# Patient Record
Sex: Female | Born: 1975 | Race: White | Hispanic: No | Marital: Single
Health system: Midwestern US, Community
[De-identification: ages and names within clinical notes are randomized; demographics above are authoritative.]

## PROBLEM LIST (undated history)

## (undated) DIAGNOSIS — F909 Attention-deficit hyperactivity disorder, unspecified type: Secondary | ICD-10-CM

## (undated) DIAGNOSIS — J449 Chronic obstructive pulmonary disease, unspecified: Secondary | ICD-10-CM

## (undated) HISTORY — PX: APPENDECTOMY: SHX54

## (undated) HISTORY — DX: Chronic obstructive pulmonary disease, unspecified: J44.9

## (undated) MED ORDER — CYCLOBENZAPRINE 10 MG TAB
10 mg | ORAL_TABLET | Freq: Three times a day (TID) | ORAL | Status: AC
Start: ? — End: 2012-10-09

## (undated) MED ORDER — HYDROCODONE-ACETAMINOPHEN 5 MG-325 MG TAB: 5-325 mg | ORAL_TABLET | ORAL | Status: AC

## (undated) MED ORDER — NAPROXEN 500 MG TAB: 500 mg | ORAL_TABLET | Freq: Two times a day (BID) | ORAL | Status: AC

---

## 2012-01-10 NOTE — ED Notes (Signed)
Pt c/o pain and swelling to top of left foot all day today. Denies injury.

## 2012-01-11 MED ORDER — OXYCODONE-ACETAMINOPHEN 5 MG-325 MG TAB
5-325 mg | ORAL_TABLET | ORAL | Status: DC | PRN
Start: 2012-01-11 — End: 2012-10-06

## 2012-01-11 MED ORDER — NAPROXEN 500 MG TAB
500 mg | ORAL_TABLET | Freq: Two times a day (BID) | ORAL | Status: AC
Start: 2012-01-11 — End: 2012-01-21

## 2012-01-11 MED ORDER — OXYCODONE-ACETAMINOPHEN 5 MG-325 MG TAB
5-325 mg | ORAL | Status: AC
Start: 2012-01-11 — End: 2012-01-11
  Administered 2012-01-11: 06:00:00 via ORAL

## 2012-01-11 MED FILL — OXYCODONE-ACETAMINOPHEN 5 MG-325 MG TAB: 5-325 mg | ORAL | Qty: 1

## 2012-01-11 NOTE — ED Notes (Signed)
Good post CMS. Demonstrated proper use of

## 2012-01-11 NOTE — ED Provider Notes (Signed)
HPI Comments: Pt is a 36yo female presenting with increasing pain to the L dorsum of the foot that has been increasing for the last 5 days.  Pt denies trauma with increasing swelling.  Pt denies fever or streaking but the pain at time radiates to the ankle.  Pt has been taking OTC meds without relief.      Patient is a 36 y.o. female presenting with foot pain.   Foot Pain   Pertinent negatives include no back pain.        Past Medical History   Diagnosis Date   ??? ADHD (attention deficit hyperactivity disorder)    ??? COPD (chronic obstructive pulmonary disease)         Past Surgical History   Procedure Date   ??? Hx appendectomy    ??? Hx cesarean section          No family history on file.     History     Social History   ??? Marital Status: SINGLE     Spouse Name: N/A     Number of Children: N/A   ??? Years of Education: N/A     Occupational History   ??? Not on file.     Social History Main Topics   ??? Smoking status: Not on file   ??? Smokeless tobacco: Not on file   ??? Alcohol Use:    ??? Drug Use:    ??? Sexually Active:      Other Topics Concern   ??? Not on file     Social History Narrative   ??? No narrative on file                  ALLERGIES: Phenergan      Review of Systems   Constitutional: Negative for fever, chills, fatigue and unexpected weight change.   HENT: Negative for congestion and rhinorrhea.    Respiratory: Negative for chest tightness and shortness of breath.    Cardiovascular: Negative for chest pain, palpitations and leg swelling.   Gastrointestinal: Negative for nausea, vomiting and abdominal pain.   Genitourinary: Negative for dysuria.   Musculoskeletal: Positive for joint swelling and arthralgias. Negative for back pain.   Skin: Negative for rash.   Neurological: Negative for dizziness and weakness.   Psychiatric/Behavioral: The patient is not nervous/anxious.        Filed Vitals:    01/10/12 2351   BP: 112/68   Pulse: 89   Temp: 98.3 ??F (36.8 ??C)   Resp: 20   Height: 5\' 9"  (1.753 m)   Weight: 63.504 kg (140  lb)   SpO2: 97%            Physical Exam   Nursing note and vitals reviewed.  Constitutional: She is oriented to person, place, and time. She appears well-developed and well-nourished. No distress.   HENT:   Head: Normocephalic and atraumatic.   Right Ear: External ear normal.   Left Ear: External ear normal.   Nose: Nose normal.   Mouth/Throat: Oropharynx is clear and moist.   Eyes: Conjunctivae and EOM are normal. Pupils are equal, round, and reactive to light. No scleral icterus.   Neck: Normal range of motion. Neck supple. No JVD present. No tracheal deviation present. No thyromegaly present.   Cardiovascular: Normal rate, regular rhythm, normal heart sounds and intact distal pulses.  Exam reveals no gallop and no friction rub.    No murmur heard.  Pulmonary/Chest: Effort normal and breath sounds normal. She  exhibits no tenderness.   Abdominal: Soft. Bowel sounds are normal. She exhibits no distension. There is no tenderness. There is no rebound and no guarding.   Musculoskeletal: Normal range of motion. She exhibits edema and tenderness.        L foot with 4 cm of dorsum erythema and pain, not hot to touch with pain increasing with hallux flexion and extension, distal sensation and pulses are intact    Lymphadenopathy:     She has no cervical adenopathy.   Neurological: She is alert and oriented to person, place, and time. She has normal reflexes. No cranial nerve deficit. Coordination normal.        No sensory loss, painful gait noted, Motor 5/5   Skin: Skin is warm and dry.   Psychiatric: She has a normal mood and affect. Her behavior is normal. Judgment and thought content normal.        MDM     Differential Diagnosis; Clinical Impression; Plan:     Pt is a 36yo female with dorsum pain to the L foot.  Exam is suggestive of tendinitis over vascular injury and not clinically concerning for infection.  Will treat her pain and perform [place] to look at the artery.  If reassuring will splint and proceed with close  outpt care. DANIELI Jancarlos Thrun, MD 1:50 AM          Procedures    Bedside US done to look at the [facility] and no obvious dilation of the artery noted.  Will splint, treat her pain, and she would like to follow with Dr. Paulina Fusi.      Splint placed by tech and in good position.  Will proceed with close outpt care and to return if at all worsened or concerned. Arnell Sieving, MD 2:21 AM

## 2012-01-11 NOTE — ED Notes (Signed)
Pt discharged from ED. Verbal and written discharge instructions given to patient (or patient representative) and patient (or patient representative) verbalized understanding. Armband removed.

## 2012-10-06 MED ORDER — CYCLOBENZAPRINE 10 MG TAB
10 mg | ORAL | Status: AC
Start: 2012-10-06 — End: 2012-10-06
  Administered 2012-10-06: 13:00:00 via ORAL

## 2012-10-06 MED ADMIN — ketorolac tromethamine (TORADOL) 60 mg/2 mL injection 60 mg: INTRAMUSCULAR | @ 13:00:00 | NDC 00409379601

## 2012-10-06 MED ADMIN — HYDROmorphone (PF) (DILAUDID) injection 1 mg: INTRAMUSCULAR | @ 13:00:00 | NDC 00409128331

## 2012-10-06 MED FILL — CYCLOBENZAPRINE 10 MG TAB: 10 mg | ORAL | Qty: 1

## 2012-10-06 MED FILL — KETOROLAC TROMETHAMINE 60 MG/2 ML IM: 60 mg/2 mL | INTRAMUSCULAR | Qty: 2

## 2012-10-06 MED FILL — HYDROMORPHONE (PF) 1 MG/ML IJ SOLN: 1 mg/mL | INTRAMUSCULAR | Qty: 1

## 2012-10-06 NOTE — ED Notes (Signed)
Pt c/o lower back pain that radiates down right leg. Pt reports trip/fall yesterday.

## 2012-10-06 NOTE — ED Provider Notes (Signed)
HPI Comments: 36 YO F presents to the ED C/O back pain. Pt states that yesterday she was walking in heels, stepped off a curb and into a hole she could not see at about 1600. Pt states that it "jolted" her back and she started having some lower back soreness. Pt took 400 mg Ibuprofen last night with some relief. Pt woke at 0200 this morning with pain but was able to get back to sleep. At 0430 Pt woke again with worsened pain. Pt states that the pain is just above her buttocks and raising her right leg exacerbates it. Pt denies incontinence, saddle paresthesias and any other Sx or complaints. Pt states that her significant other is driving, he is in the ED.        Past Medical History   Diagnosis Date   ??? ADHD (attention deficit hyperactivity disorder)    ??? COPD (chronic obstructive pulmonary disease)         Past Surgical History   Procedure Laterality Date   ??? Hx appendectomy     ??? Hx cesarean section           History reviewed. No pertinent family history.     History     Social History   ??? Marital Status: SINGLE     Spouse Name: N/A     Number of Children: N/A   ??? Years of Education: N/A     Occupational History   ??? Not on file.     Social History Main Topics   ??? Smoking status: Current Every Day Smoker -- 0.50 packs/day   ??? Smokeless tobacco: Never Used   ??? Alcohol Use: No   ??? Drug Use: No   ??? Sexually Active: Not on file     Other Topics Concern   ??? Not on file     Social History Narrative   ??? No narrative on file                  ALLERGIES: Phenergan      Review of Systems  Constitutional:  Denies malaise, fever, chills.   Head:  Denies injury.   Face:  Denies injury or pain.   ENMT:  Denies sore throat.   Neck:  Denies injury or pain.   Chest:  Denies injury.   Cardiac:  Denies chest pain or palpitations.   Respiratory:  Denies cough, wheezing, difficulty breathing, shortness of breath.   GI/ABD:  Denies injury, pain, distention, nausea, vomiting, diarrhea.   GU:  Denies injury, pain, dysuria or urgency.    Back: See HPI.  Pelvis:  Denies injury or pain.   Extremity/MS:  Denies injury or pain.   Neuro:  Denies headache, LOC, dizziness, neurologic symptoms/deficits/paresthesias.   Skin: Denies injury, rash, itching or skin changes.    Filed Vitals:    10/06/12 0730   BP: 114/70   Pulse: 94   Temp: 98.8 ??F (37.1 ??C)   Resp: 16   Height: 5\' 9"  (1.753 m)   Weight: 59.875 kg (132 lb)   SpO2: 100%            Physical Exam   Nursing note and vitals reviewed.  Constitutional: She is oriented to person, place, and time. She appears well-developed and well-nourished.  Non-toxic appearance. She does not have a sickly appearance. She does not appear ill. No distress.   HENT:   Head: Normocephalic and atraumatic.   Mouth/Throat: Oropharynx is clear and moist. No oropharyngeal exudate.  Eyes: Conjunctivae and EOM are normal. Pupils are equal, round, and reactive to light. No scleral icterus.   Neck: Normal range of motion. Neck supple. No hepatojugular reflux and no JVD present. No tracheal deviation present. No thyromegaly present.   Cardiovascular: Normal rate, regular rhythm, S1 normal, S2 normal, normal heart sounds, intact distal pulses and normal pulses.  Exam reveals no gallop, no S3 and no S4.    No murmur heard.  Pulses:       Radial pulses are 2+ on the right side, and 2+ on the left side.        Dorsalis pedis pulses are 2+ on the right side, and 2+ on the left side.   Pulmonary/Chest: Effort normal and breath sounds normal. No respiratory distress. She has no decreased breath sounds. She has no wheezes. She has no rhonchi. She has no rales.   Abdominal: Soft. Normal appearance and bowel sounds are normal. She exhibits no distension and no mass. There is no hepatosplenomegaly. There is no tenderness. There is no rigidity, no rebound, no guarding, no CVA tenderness, no tenderness at McBurney's point and negative Murphy's sign.   Musculoskeletal: Normal range of motion. She exhibits no edema and no tenderness.         Back:    Lymphadenopathy:        Head (right side): No submental, no submandibular, no preauricular and no occipital adenopathy present.        Head (left side): No submental, no submandibular, no preauricular and no occipital adenopathy present.     She has no cervical adenopathy.        Right: No supraclavicular adenopathy present.        Left: No supraclavicular adenopathy present.   Neurological: She is alert and oriented to person, place, and time. She has normal strength and normal reflexes. She is not disoriented. No cranial nerve deficit or sensory deficit. Coordination and gait normal. GCS eye subscore is 4. GCS verbal subscore is 5. GCS motor subscore is 6.   No neurological deficits   Skin: Skin is warm, dry and intact. No rash noted. She is not diaphoretic.   Psychiatric: She has a normal mood and affect. Her speech is normal and behavior is normal. Judgment and thought content normal. Cognition and memory are normal.        MDM     Differential Diagnosis; Clinical Impression; Plan:     Back pain   Back strain  Patient was discharged in stable condition.  Patient was reassessed and is pain free.  Patient was discharged with prescriptions for Flexeril, Vicodin and Naprosyn. Patient is to return to emergency department if any new or worsening condition.    9:37 AM, 10/06/2012      Procedures    Scribe Attestation:   October 06, 2012 at 8:10 AM - Janet Berlin scribing for and in the presence of Dr.Danni Shima, DO     Janet Berlin, Scribe    Provider Attestation:   I personally performed the services described in the documentation, reviewed the documentation, as recorded by the scribe in my presence, and it accurately and completely records my words and actions. October 06, 2012 at 9:41 AM - Maura Crandall, DO

## 2012-10-06 NOTE — ED Notes (Signed)
Pt presents to the ED with lower back pain which radiated to the right leg onset this morning. Pt states last night loss her balance when she stepped into a hole in the ground while wearing high-heels. Pt states shooting pain in the right leg. Pt states pain is 10/10. Pt denies numbness and tingling lower extremities, bilaterally. Pt without nasal flaring, grunting, stridor, or retractions. Pt appears in NOAD.

## 2012-10-06 NOTE — ED Notes (Signed)
Pt resting on the stretcher. Pt states, "pain is not completely gone, but it's better." Pt rates pain 5/10. Pt appears in NOAD.

## 2012-10-06 NOTE — ED Notes (Signed)
Pt states ready for discharge. Pt states she will follow up with PCP as instructed by provider. Pt appears in NOAD.    I have reviewed discharge instructions with the patient. Prescriptions were reviewed with patient instructed not to drink alcohol, drive a car, or operate heavy machinery while taking this medicine. The patient verbalized understanding. Patient seen leaving ED ambulatory without difficulty or need for assistance, with S/O in no sign of distress. Patient armband removed and shredded.

## 2012-10-06 NOTE — ED Notes (Signed)
Pt tolerated injection without difficulties. Pt resting on the stretcher. Pt remains in NOAD.

## 2014-01-23 ENCOUNTER — Encounter (HOSPITAL_COMMUNITY): Payer: Self-pay | Admitting: Emergency Medicine

## 2014-01-23 ENCOUNTER — Emergency Department (INDEPENDENT_AMBULATORY_CARE_PROVIDER_SITE_OTHER)
Admission: EM | Admit: 2014-01-23 | Discharge: 2014-01-23 | Disposition: A | Discharge status: Home / Self Care | Payer: No Typology Code available for payment source | Source: Home / Self Care

## 2014-01-23 DIAGNOSIS — H109 Unspecified conjunctivitis: Secondary | ICD-10-CM

## 2014-01-23 HISTORY — DX: Attention-deficit hyperactivity disorder, unspecified type: F90.9

## 2014-01-23 MED ORDER — HYDROCODONE-ACETAMINOPHEN 5-325 MG PO TABS
1.0000 | ORAL_TABLET | ORAL | Status: DC | PRN
Start: 2014-01-23 — End: 2014-05-27

## 2014-01-23 MED ORDER — ERYTHROMYCIN 5 MG/GM OP OINT: TOPICAL_OINTMENT | OPHTHALMIC | Status: DC

## 2014-01-23 MED ORDER — TETRACAINE HCL 0.5 % OP SOLN
2.0000 [drp] | Freq: Once | OPHTHALMIC | Status: AC
Start: 2014-01-23 — End: 2014-01-23
  Administered 2014-01-23: 2 [drp] via OPHTHALMIC

## 2014-01-23 MED ORDER — TETRACAINE HCL 0.5 % OP SOLN
OPHTHALMIC | Status: AC
  Filled 2014-01-23: qty 2

## 2014-01-23 NOTE — Discharge Instructions (Signed)

## 2014-01-23 NOTE — ED Notes (Signed)
C/o pain and swelling left eye lid since 3-26.

## 2014-01-23 NOTE — ED Provider Notes (Signed)
CSN: [id number]     Arrival date & time 01/23/14  0815 History   First MD Initiated Contact with Patient 01/23/14 [phone]     Chief Complaint  Patient presents with  . Facial Swelling   (Consider location/radiation/quality/duration/timing/severity/associated sxs/prior Treatment) HPI Comments: OS pain for 4 d. After removing her contact lens had a FB sensation. F/B swelling mostly of the upper lid, brown colored drainage. No known trauma   Past Medical History  Diagnosis Date  . Adult ADHD    History reviewed. No pertinent past surgical history. History reviewed. No pertinent family history. History  Substance Use Topics  . Smoking status: Current Every Day Smoker  . Smokeless tobacco: Not on file  . Alcohol Use: No   OB History   Grav Para Term Preterm Abortions TAB SAB Ect Mult Living                 Review of Systems  Eyes: Positive for photophobia, discharge and redness.  All other systems reviewed and are negative.    Allergies  Phenergan  Home Medications   Current Outpatient Rx  Name  Route  Sig  Dispense  Refill  . albuterol (PROVENTIL HFA;VENTOLIN HFA) 108 (90 BASE) MCG/ACT inhaler   Inhalation   Inhale into the lungs every 6 (six) hours as needed for wheezing or shortness of breath.         . amphetamine-dextroamphetamine (ADDERALL) 20 MG tablet   Oral   Take 20 mg by mouth 3 (three) times daily.         . fluticasone-salmeterol (ADVAIR HFA) 45-21 MCG/ACT inhaler   Inhalation   Inhale 2 puffs into the lungs 2 (two) times daily.         Marland Kitchen. erythromycin ophthalmic ointment      Place a 1/2 inch ribbon of ointment into the lower eyelid tid   1 g   0   . HYDROcodone-acetaminophen (NORCO/VICODIN) 5-325 MG per tablet   Oral   Take 1 tablet by mouth every 4 (four) hours as needed.   10 tablet   0    BP 108/52  Pulse 69  Temp(Src) 98.3 F (36.8 C) (Oral)  Resp 12  SpO2 96% Physical Exam  Nursing note and vitals reviewed. Constitutional: She  is oriented to person, place, and time. She appears well-developed and well-nourished. No distress.  Eyes: EOM are normal. Pupils are equal, round, and reactive to light. Left eye exhibits discharge.  Swelling of the upper eye lid. Mild swelling and redness lower lid.  Under magnification and stain no uptake, no FB seen. Eye irrigated.   Neurological: She is alert and oriented to person, place, and time.  Skin: Skin is warm and dry.  Psychiatric: She has a normal mood and affect.    ED Course  Procedures (including critical care time) Labs Review Labs Reviewed - No data to display Imaging Review No results found.   MDM   1. Left conjunctivitis     Erythromycin op oint Warm compresses If not better or worse cal above eye Dr.    Hayden Rasmussenavid Lisa Milian, NP 01/23/14 [phone]

## 2014-01-24 NOTE — ED Provider Notes (Signed)
Medical screening examination/treatment/procedure(s) were performed by a resident physician or non-physician practitioner and as the supervising physician I was immediately available for consultation/collaboration.  Evan Corey, MD    Evan S Corey, MD 01/24/14 1644 

## 2014-05-27 ENCOUNTER — Emergency Department (HOSPITAL_COMMUNITY): Payer: Medicaid Other

## 2014-05-27 ENCOUNTER — Emergency Department (HOSPITAL_COMMUNITY)
Admission: EM | Admit: 2014-05-27 | Discharge: 2014-05-28 | Disposition: A | Discharge status: Home / Self Care | Payer: Medicaid Other | Attending: Emergency Medicine | Admitting: Emergency Medicine

## 2014-05-27 ENCOUNTER — Encounter (HOSPITAL_COMMUNITY): Payer: Self-pay | Admitting: Emergency Medicine

## 2014-05-27 DIAGNOSIS — F909 Attention-deficit hyperactivity disorder, unspecified type: Secondary | ICD-10-CM | POA: Diagnosis not present

## 2014-05-27 DIAGNOSIS — S6980XA Other specified injuries of unspecified wrist, hand and finger(s), initial encounter: Secondary | ICD-10-CM | POA: Diagnosis present

## 2014-05-27 DIAGNOSIS — F17200 Nicotine dependence, unspecified, uncomplicated: Secondary | ICD-10-CM | POA: Insufficient documentation

## 2014-05-27 DIAGNOSIS — S6990XA Unspecified injury of unspecified wrist, hand and finger(s), initial encounter: Secondary | ICD-10-CM | POA: Insufficient documentation

## 2014-05-27 DIAGNOSIS — Y9289 Other specified places as the place of occurrence of the external cause: Secondary | ICD-10-CM | POA: Insufficient documentation

## 2014-05-27 DIAGNOSIS — S62639B Displaced fracture of distal phalanx of unspecified finger, initial encounter for open fracture: Secondary | ICD-10-CM | POA: Insufficient documentation

## 2014-05-27 DIAGNOSIS — Z79899 Other long term (current) drug therapy: Secondary | ICD-10-CM | POA: Insufficient documentation

## 2014-05-27 DIAGNOSIS — W230XXA Caught, crushed, jammed, or pinched between moving objects, initial encounter: Secondary | ICD-10-CM | POA: Insufficient documentation

## 2014-05-27 DIAGNOSIS — Y9389 Activity, other specified: Secondary | ICD-10-CM | POA: Diagnosis not present

## 2014-05-27 NOTE — ED Notes (Signed)
Pt states that she slammed her pinky on her right hand in her truck door this evening.

## 2014-05-28 MED ORDER — OXYCODONE-ACETAMINOPHEN 5-325 MG PO TABS
1.0000 | ORAL_TABLET | Freq: Once | ORAL | Status: AC
  Administered 2014-05-28: 1 via ORAL
  Filled 2014-05-28: qty 1

## 2014-05-28 MED ORDER — CEPHALEXIN 500 MG PO CAPS: 500.0000 mg | ORAL_CAPSULE | Freq: Four times a day (QID) | ORAL | Status: DC

## 2014-05-28 MED ORDER — OXYCODONE-ACETAMINOPHEN 5-325 MG PO TABS: 1.0000 | ORAL_TABLET | ORAL | Status: DC | PRN

## 2014-05-28 NOTE — Discharge Instructions (Signed)
Take the prescribed medication as directed.  Wear splint and leave bandaged for now. Follow-up with Dr. Izora Ribasoley-- call office and schedule appt. Return to the ED for new or worsening symptoms.

## 2014-05-28 NOTE — ED Provider Notes (Signed)
Medical screening examination/treatment/procedure(s) were performed by non-physician practitioner and as supervising physician I was immediately available for consultation/collaboration.   Dione Boozeavid Scarlett Portlock, MD 05/28/14 [phone]

## 2014-05-28 NOTE — ED Provider Notes (Signed)
CSN: [id number]     Arrival date & time 05/27/14  2140 History   First MD Initiated Contact with Patient 05/27/14 2201     Chief Complaint  Patient presents with  . Finger Injury     (Consider location/radiation/quality/duration/timing/severity/associated sxs/prior Treatment) The history is provided by the patient and medical records.   This is a 38 year old female with past medical history significant for ADHD, presenting to the ED for right little finger injury. Patient states she accidentally slammed her finger in the door of her truck this evening.  Sustained a laceration of distal finger and nail was lifted up.  States she has some numb sensations of her distal finger.  Pt is right hand dominant.  Tetanus UTD.  Past Medical History  Diagnosis Date  . Adult ADHD    Past Surgical History  Procedure Laterality Date  . Cesarean section     History reviewed. No pertinent family history. History  Substance Use Topics  . Smoking status: Current Every Day Smoker  . Smokeless tobacco: Not on file  . Alcohol Use: No   OB History   Grav Para Term Preterm Abortions TAB SAB Ect Mult Living   1              Review of Systems  Musculoskeletal: Positive for arthralgias.  Skin: Positive for wound.  All other systems reviewed and are negative.     Allergies  Phenergan  Home Medications   Prior to Admission medications   Medication Sig Start Date End Date Taking? Authorizing Provider  albuterol (PROVENTIL HFA;VENTOLIN HFA) 108 (90 BASE) MCG/ACT inhaler Inhale 2 puffs into the lungs every 6 (six) hours as needed for wheezing or shortness of breath.    Yes Historical Provider, MD  amphetamine-dextroamphetamine (ADDERALL) 20 MG tablet Take 20 mg by mouth 3 (three) times daily.   Yes Historical Provider, MD   BP 111/48  Pulse 73  Resp 16  Ht 5\' 9"  (1.753 m)  Wt 138 lb (62.596 kg)  BMI 20.37 kg/m2  SpO2 97%  Physical Exam  Nursing note and vitals reviewed. Constitutional:  She is oriented to person, place, and time. She appears well-developed and well-nourished. No distress.  HENT:  Head: Normocephalic and atraumatic.  Mouth/Throat: Oropharynx is clear and moist.  Eyes: Conjunctivae and EOM are normal. Pupils are equal, round, and reactive to light.  Neck: Normal range of motion. Neck supple.  Cardiovascular: Normal rate, regular rhythm and normal heart sounds.   Pulmonary/Chest: Effort normal and breath sounds normal. No respiratory distress. She has no wheezes.  Musculoskeletal: Normal range of motion. She exhibits no edema.       Hands: Right little finger with laceration just proximal to nail bed measuring approximately 0.5 cm, bleeding well controlled; nail remains intact at nailbed but uplifted slightly at distal portion; full flexion/extension of finger maintained; strong radial pulse and cap refill; sensation intact diffusely throughout finger  Neurological: She is alert and oriented to person, place, and time.  Skin: Skin is warm and dry. She is not diaphoretic.  Psychiatric: She has a normal mood and affect.    ED Course  Procedures (including critical care time) Labs Review Labs Reviewed - No data to display  Imaging Review Dg Finger Little Right  05/27/2014   CLINICAL DATA:  Shut in car door  EXAM: RIGHT LITTLE FINGER 2+V  COMPARISON:  None.  FINDINGS: Soft tissue injury to the nail  Nondisplaced fracture of the tuft of the distal phalanx best  seen on the lateral view. The fracture is on the ventral surface.  IMPRESSION: Nondisplaced fracture of the ventral tuft of the distal phalanx.   Electronically Signed   By: Marlan Palau M.D.   On: 05/27/2014 23:49     EKG Interpretation None      MDM   Final diagnoses:  Open fracture of tuft of distal phalanx of finger, initial encounter   38 year old female status post right little finger injury after slamming it in a car door. On exam, she has a laceration just proximal to her nailbed and  distal nail is slightly uprooted but remains intact.  Finger remains NVI.  X-ray revealing tuft fracture of distal phalanx.  Patient with essentially open fracture, will leave nail in place for now. Tetanus UTD, will start on abx.  Finger bandaged and placed in splint, will have her FU with hand surgery this week.  Discussed plan with patient, he/she acknowledged understanding and agreed with plan of care.  Return precautions given for new or worsening symptoms.  Case discussed with attending physician, Dr. Preston Fleeting, who agrees with assessment and plan of care.  Garlon Hatchet, PA-C 05/28/14 0103  Garlon Hatchet, PA-C 05/28/14 0127

## 2014-05-30 ENCOUNTER — Telehealth: Payer: Self-pay | Admitting: *Deleted

## 2014-05-30 ENCOUNTER — Other Ambulatory Visit (HOSPITAL_COMMUNITY)
Admission: RE | Admit: 2014-05-30 | Discharge: 2014-05-30 | Disposition: A | Discharge status: Home / Self Care | Payer: Medicaid Other | Source: Ambulatory Visit | Attending: Obstetrics and Gynecology | Admitting: Obstetrics and Gynecology

## 2014-05-30 ENCOUNTER — Encounter: Payer: Self-pay | Admitting: Obstetrics and Gynecology

## 2014-05-30 ENCOUNTER — Ambulatory Visit (INDEPENDENT_AMBULATORY_CARE_PROVIDER_SITE_OTHER): Payer: Medicaid Other | Admitting: Obstetrics and Gynecology

## 2014-05-30 VITALS — BP 133/56 | HR 74 | Wt 147.1 lb

## 2014-05-30 DIAGNOSIS — O3680X1 Pregnancy with inconclusive fetal viability, fetus 1: Secondary | ICD-10-CM

## 2014-05-30 DIAGNOSIS — Z01419 Encounter for gynecological examination (general) (routine) without abnormal findings: Secondary | ICD-10-CM | POA: Insufficient documentation

## 2014-05-30 DIAGNOSIS — F1920 Other psychoactive substance dependence, uncomplicated: Secondary | ICD-10-CM

## 2014-05-30 DIAGNOSIS — Z113 Encounter for screening for infections with a predominantly sexual mode of transmission: Secondary | ICD-10-CM | POA: Diagnosis present

## 2014-05-30 DIAGNOSIS — O09529 Supervision of elderly multigravida, unspecified trimester: Secondary | ICD-10-CM | POA: Insufficient documentation

## 2014-05-30 DIAGNOSIS — O3680X0 Pregnancy with inconclusive fetal viability, not applicable or unspecified: Secondary | ICD-10-CM

## 2014-05-30 DIAGNOSIS — F17200 Nicotine dependence, unspecified, uncomplicated: Secondary | ICD-10-CM

## 2014-05-30 DIAGNOSIS — O0990 Supervision of high risk pregnancy, unspecified, unspecified trimester: Secondary | ICD-10-CM | POA: Insufficient documentation

## 2014-05-30 DIAGNOSIS — O99320 Drug use complicating pregnancy, unspecified trimester: Secondary | ICD-10-CM | POA: Insufficient documentation

## 2014-05-30 DIAGNOSIS — Z1151 Encounter for screening for human papillomavirus (HPV): Secondary | ICD-10-CM | POA: Insufficient documentation

## 2014-05-30 DIAGNOSIS — O34219 Maternal care for unspecified type scar from previous cesarean delivery: Secondary | ICD-10-CM | POA: Insufficient documentation

## 2014-05-30 LAB — POCT URINALYSIS DIP (DEVICE)
GLUCOSE, UA: NEGATIVE mg/dL
Hgb urine dipstick: NEGATIVE
Ketones, ur: 15 mg/dL — AB
Leukocytes, UA: NEGATIVE
Nitrite: NEGATIVE
Protein, ur: NEGATIVE mg/dL
Specific Gravity, Urine: 1.025 (ref 1.005–1.030)
UROBILINOGEN UA: 1 mg/dL (ref 0.0–1.0)
pH: 5.5 (ref 5.0–8.0)

## 2014-05-30 LAB — US OB COMP + 14 WK

## 2014-05-30 MED ORDER — ONDANSETRON 4 MG PO TBDP: 4.0000 mg | ORAL_TABLET | Freq: Four times a day (QID) | ORAL | Status: DC | PRN

## 2014-05-30 MED ORDER — DOCUSATE SODIUM 100 MG PO CAPS: 100.0000 mg | ORAL_CAPSULE | Freq: Two times a day (BID) | ORAL | Status: DC | PRN

## 2014-05-30 NOTE — Progress Notes (Addendum)
Bedside US for FHR and viability; Single IUP,  FHR - 152 per PW doppler, FM observed. Dr. Jolayne Pantheronstant informed of results

## 2014-05-30 NOTE — Addendum Note (Signed)
Addended by: Jill SideAY, DIANE L on: 05/30/2014 12:17 PM   Modules accepted: Orders

## 2014-05-30 NOTE — Progress Notes (Signed)
   Subjective:    Paige PlummerSherri Bailey is a G1P0 Unknown being seen today for her first obstetrical visit.  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 Patient does intend to breast feed. Pregnancy history fully reviewed.  Patient reports no complaints.  Filed Vitals:   05/30/14 1030  BP: 133/56  Pulse: 74  Weight: 147 lb 1.6 oz (66.724 kg)    HISTORY: OB History  Gravida Para Term Preterm AB SAB TAB Ectopic Multiple Living  1             # Outcome Date GA Lbr Len/2nd Weight Sex Delivery Anes PTL Lv  1 CUR              Past Medical History  Diagnosis Date  . Adult ADHD    Past Surgical History  Procedure Laterality Date  . Cesarean section     No family history on file.   Exam    Uterus:     Pelvic Exam:    Perineum: Normal Perineum   Vulva: normal   Vagina:  normal mucosa, normal discharge   pH:    Cervix: closed and long   Adnexa: no mass, fullness, tenderness   Bony Pelvis: android  System: Breast:  normal appearance, no masses or tenderness   Skin: normal coloration and turgor, no rashes    Neurologic: oriented, no focal deficits   Extremities: normal strength, tone, and muscle mass   HEENT extra ocular movement intact   Mouth/Teeth mucous membranes moist, pharynx normal without lesions   Neck supple and no masses   Cardiovascular: regular rate and rhythm   Respiratory:  chest clear, no wheezing, crepitations, rhonchi, normal symmetric air entry   Abdomen: soft, non-tender; bowel sounds normal; no masses,  no organomegaly   Urinary:       Assessment:    Pregnancy: G1P0 There are no active problems to display for this patient.       Plan:     Initial labs drawn. Prenatal vitamins. Problem list reviewed and updated. Genetic Screening discussed : requested.  Ultrasound discussed; fetal survey: requested. Dating ultrasound ordered  Follow up in 4 weeks. 50% of 30  min visit spent on counseling and coordination of care.     Lazarus Sudbury 05/30/2014

## 2014-05-30 NOTE — Telephone Encounter (Signed)
Called MFM and made appt and called patient - 06/25/14 1:00

## 2014-05-30 NOTE — Telephone Encounter (Signed)
Message copied by Gerome ApleyZEYFANG, LINDA L on Thu May 30, 2014 12:33 PM ------      Message from: Raynald BlendZEYFANG, LINDA L      Created: Thu May 30, 2014 12:25 PM       Needs appt for MFM genetic counseling and US- call patient with appt.  ------

## 2014-05-30 NOTE — Progress Notes (Signed)
Reports smoking marijuana to keep her from throwing up. Requests RX for zofran-- states she cannot take phenergan as it makes her "go crazy."  Reports pelvic pain though believes it is r/t constipation-- Requests RX.  Discussed appropriate weight gain (25-35lb); pt. Verbalized understanding.  New OB packet given.  New OB labs today.

## 2014-05-30 NOTE — Telephone Encounter (Signed)
appt for us should be done week of 8/31 per Dr. Jolayne Pantheronstant

## 2014-05-30 NOTE — Patient Instructions (Signed)
First Trimester of Pregnancy The first trimester of pregnancy is from week 1 until the end of week 12 (months 1 through 3). A week after a sperm fertilizes an egg, the egg will implant on the wall of the uterus. This embryo will begin to develop into a baby. Genes from you and your partner are forming the baby. The female genes determine whether the baby is a boy or a girl. At 6-8 weeks, the eyes and face are formed, and the heartbeat can be seen on ultrasound. At the end of 12 weeks, all the baby's organs are formed.  Now that you are pregnant, you will want to do everything you can to have a healthy baby. Two of the most important things are to get good prenatal care and to follow your health care provider's instructions. Prenatal care is all the medical care you receive before the baby's birth. This care will help prevent, find, and treat any problems during the pregnancy and childbirth. BODY CHANGES Your body goes through many changes during pregnancy. The changes vary from woman to woman.   You may gain or lose a couple of pounds at first.  You may feel sick to your stomach (nauseous) and throw up (vomit). If the vomiting is uncontrollable, call your health care provider.  You may tire easily.  You may develop headaches that can be relieved by medicines approved by your health care provider.  You may urinate more often. Painful urination may mean you have a bladder infection.  You may develop heartburn as a result of your pregnancy.  You may develop constipation because certain hormones are causing the muscles that push waste through your intestines to slow down.  You may develop hemorrhoids or swollen, bulging veins (varicose veins).  Your breasts may begin to grow larger and become tender. Your nipples may stick out more, and the tissue that surrounds them (areola) may become darker.  Your gums may bleed and may be sensitive to brushing and flossing.  Dark spots or blotches  (chloasma, mask of pregnancy) may develop on your face. This will likely fade after the baby is born.  Your menstrual periods will stop.  You may have a loss of appetite.  You may develop cravings for certain kinds of food.  You may have changes in your emotions from day to day, such as being excited to be pregnant or being concerned that something may go wrong with the pregnancy and baby.  You may have more vivid and strange dreams.  You may have changes in your hair. These can include thickening of your hair, rapid growth, and changes in texture. Some women also have hair loss during or after pregnancy, or hair that feels dry or thin. Your hair will most likely return to normal after your baby is born. WHAT TO EXPECT AT YOUR PRENATAL VISITS During a routine prenatal visit:  You will be weighed to make sure you and the baby are growing normally.  Your blood pressure will be taken.  Your abdomen will be measured to track your baby's growth.  The fetal heartbeat will be listened to starting around week 10 or 12 of your pregnancy.  Test results from any previous visits will be discussed. Your health care provider may ask you:  How you are feeling.  If you are feeling the baby move.  If you have had any abnormal symptoms, such as leaking fluid, bleeding, severe headaches, or abdominal cramping.  If you have any questions. Other tests   that may be performed during your first trimester include:  Blood tests to find your blood type and to check for the presence of any previous infections. They will also be used to check for low iron levels (anemia) and Rh antibodies. Later in the pregnancy, blood tests for diabetes will be done along with other tests if problems develop.  Urine tests to check for infections, diabetes, or protein in the urine.  An ultrasound to confirm the proper growth and development of the baby.  An amniocentesis to check for possible genetic problems.  Fetal  screens for spina bifida and Down syndrome.  You may need other tests to make sure you and the baby are doing well. HOME CARE INSTRUCTIONS  Medicines  Follow your health care provider's instructions regarding medicine use. Specific medicines may be either safe or unsafe to take during pregnancy.  Take your prenatal vitamins as directed.  If you develop constipation, try taking a stool softener if your health care provider approves. Diet  Eat regular, well-balanced meals. Choose a variety of foods, such as meat or vegetable-based protein, fish, milk and low-fat dairy products, vegetables, fruits, and whole grain breads and cereals. Your health care provider will help you determine the amount of weight gain that is right for you.  Avoid raw meat and uncooked cheese. These carry germs that can cause birth defects in the baby.  Eating four or five small meals rather than three large meals a day may help relieve nausea and vomiting. If you start to feel nauseous, eating a few soda crackers can be helpful. Drinking liquids between meals instead of during meals also seems to help nausea and vomiting.  If you develop constipation, eat more high-fiber foods, such as fresh vegetables or fruit and whole grains. Drink enough fluids to keep your urine clear or pale yellow. Activity and Exercise  Exercise only as directed by your health care provider. Exercising will help you:  Control your weight.  Stay in shape.  Be prepared for labor and delivery.  Experiencing pain or cramping in the lower abdomen or low back is a good sign that you should stop exercising. Check with your health care provider before continuing normal exercises.  Try to avoid standing for long periods of time. Move your legs often if you must stand in one place for a long time.  Avoid heavy lifting.  Wear low-heeled shoes, and practice good posture.  You may continue to have sex unless your health care provider directs you  otherwise. Relief of Pain or Discomfort  Wear a good support bra for breast tenderness.   Take warm sitz baths to soothe any pain or discomfort caused by hemorrhoids. Use hemorrhoid cream if your health care provider approves.   Rest with your legs elevated if you have leg cramps or low back pain.  If you develop varicose veins in your legs, wear support hose. Elevate your feet for 15 minutes, 3-4 times a day. Limit salt in your diet. Prenatal Care  Schedule your prenatal visits by the twelfth week of pregnancy. They are usually scheduled monthly at first, then more often in the last 2 months before delivery.  Write down your questions. Take them to your prenatal visits.  Keep all your prenatal visits as directed by your health care provider. Safety  Wear your seat belt at all times when driving.  Make a list of emergency phone numbers, including numbers for family, friends, the hospital, and police and fire departments. General Tips    Ask your health care provider for a referral to a local prenatal education class. Begin classes no later than at the beginning of month 6 of your pregnancy.  Ask for help if you have counseling or nutritional needs during pregnancy. Your health care provider can offer advice or refer you to specialists for help with various needs.  Do not use hot tubs, steam rooms, or saunas.  Do not douche or use tampons or scented sanitary pads.  Do not cross your legs for long periods of time.  Avoid cat litter boxes and soil used by cats. These carry germs that can cause birth defects in the baby and possibly loss of the fetus by miscarriage or stillbirth.  Avoid all smoking, herbs, alcohol, and medicines not prescribed by your health care provider. Chemicals in these affect the formation and growth of the baby.  Schedule a dentist appointment. At home, brush your teeth with a soft toothbrush and be gentle when you floss. SEEK MEDICAL CARE IF:   You have  dizziness.  You have mild pelvic cramps, pelvic pressure, or nagging pain in the abdominal area.  You have persistent nausea, vomiting, or diarrhea.  You have a bad smelling vaginal discharge.  You have pain with urination.  You notice increased swelling in your face, hands, legs, or ankles. SEEK IMMEDIATE MEDICAL CARE IF:   You have a fever.  You are leaking fluid from your vagina.  You have spotting or bleeding from your vagina.  You have severe abdominal cramping or pain.  You have rapid weight gain or loss.  You vomit blood or material that looks like coffee grounds.  You are exposed to German measles and have never had them.  You are exposed to fifth disease or chickenpox.  You develop a severe headache.  You have shortness of breath.  You have any kind of trauma, such as from a fall or a car accident. Document Released: 10/05/2001 Document Revised: 02/25/2014 Document Reviewed: 08/21/2013 [employer] Patient Information 2015 [employer]. This information is not intended to replace advice given to you by your health care provider. Make sure you discuss any questions you have with your health care provider.  Contraception Choices Contraception (birth control) is the use of any methods or devices to prevent pregnancy. Below are some methods to help avoid pregnancy. HORMONAL METHODS   Contraceptive implant. This is a thin, plastic tube containing progesterone hormone. It does not contain estrogen hormone. Your health care provider inserts the tube in the inner part of the upper arm. The tube can remain in place for up to 3 years. After 3 years, the implant must be removed. The implant prevents the ovaries from releasing an egg (ovulation), thickens the cervical mucus to prevent sperm from entering the uterus, and thins the lining of the inside of the uterus.  Progesterone-only injections. These injections are given every 3 months by your health care provider to prevent  pregnancy. This synthetic progesterone hormone stops the ovaries from releasing eggs. It also thickens cervical mucus and changes the uterine lining. This makes it harder for sperm to survive in the uterus.  Birth control pills. These pills contain estrogen and progesterone hormone. They work by preventing the ovaries from releasing eggs (ovulation). They also cause the cervical mucus to thicken, preventing the sperm from entering the uterus. Birth control pills are prescribed by a health care provider.Birth control pills can also be used to treat heavy periods.  Minipill. This type of birth control pill contains   only the progesterone hormone. They are taken every day of each month and must be prescribed by your health care provider.  Birth control patch. The patch contains hormones similar to those in birth control pills. It must be changed once a week and is prescribed by a health care provider.  Vaginal ring. The ring contains hormones similar to those in birth control pills. It is left in the vagina for 3 weeks, removed for 1 week, and then a new one is put back in place. The patient must be comfortable inserting and removing the ring from the vagina.A health care provider's prescription is necessary.  Emergency contraception. Emergency contraceptives prevent pregnancy after unprotected sexual intercourse. This pill can be taken right after sex or up to 5 days after unprotected sex. It is most effective the sooner you take the pills after having sexual intercourse. Most emergency contraceptive pills are available without a prescription. Check with your pharmacist. Do not use emergency contraception as your only form of birth control. BARRIER METHODS   Female condom. This is a thin sheath (latex or rubber) that is worn over the penis during sexual intercourse. It can be used with spermicide to increase effectiveness.  Female condom. This is a soft, loose-fitting sheath that is put into the vagina  before sexual intercourse.  Diaphragm. This is a soft, latex, dome-shaped barrier that must be fitted by a health care provider. It is inserted into the vagina, along with a spermicidal jelly. It is inserted before intercourse. The diaphragm should be left in the vagina for 6 to 8 hours after intercourse.  Cervical cap. This is a round, soft, latex or plastic cup that fits over the cervix and must be fitted by a health care provider. The cap can be left in place for up to 48 hours after intercourse.  Sponge. This is a soft, circular piece of polyurethane foam. The sponge has spermicide in it. It is inserted into the vagina after wetting it and before sexual intercourse.  Spermicides. These are chemicals that kill or block sperm from entering the cervix and uterus. They come in the form of creams, jellies, suppositories, foam, or tablets. They do not require a prescription. They are inserted into the vagina with an applicator before having sexual intercourse. The process must be repeated every time you have sexual intercourse. INTRAUTERINE CONTRACEPTION  Intrauterine device (IUD). This is a T-shaped device that is put in a woman's uterus during a menstrual period to prevent pregnancy. There are 2 types:  Copper IUD. This type of IUD is wrapped in copper wire and is placed inside the uterus. Copper makes the uterus and fallopian tubes produce a fluid that kills sperm. It can stay in place for 10 years.  Hormone IUD. This type of IUD contains the hormone progestin (synthetic progesterone). The hormone thickens the cervical mucus and prevents sperm from entering the uterus, and it also thins the uterine lining to prevent implantation of a fertilized egg. The hormone can weaken or kill the sperm that get into the uterus. It can stay in place for 3-5 years, depending on which type of IUD is used. PERMANENT METHODS OF CONTRACEPTION  Female tubal ligation. This is when the woman's fallopian tubes are  surgically sealed, tied, or blocked to prevent the egg from traveling to the uterus.  Hysteroscopic sterilization. This involves placing a small coil or insert into each fallopian tube. Your doctor uses a technique called hysteroscopy to do the procedure. The device causes scar tissue   to form. This results in permanent blockage of the fallopian tubes, so the sperm cannot fertilize the egg. It takes about 3 months after the procedure for the tubes to become blocked. You must use another form of birth control for these 3 months.  Female sterilization. This is when the female has the tubes that carry sperm tied off (vasectomy).This blocks sperm from entering the vagina during sexual intercourse. After the procedure, the man can still ejaculate fluid (semen). NATURAL PLANNING METHODS  Natural family planning. This is not having sexual intercourse or using a barrier method (condom, diaphragm, cervical cap) on days the woman could become pregnant.  Calendar method. This is keeping track of the length of each menstrual cycle and identifying when you are fertile.  Ovulation method. This is avoiding sexual intercourse during ovulation.  Symptothermal method. This is avoiding sexual intercourse during ovulation, using a thermometer and ovulation symptoms.  Post-ovulation method. This is timing sexual intercourse after you have ovulated. Regardless of which type or method of contraception you choose, it is important that you use condoms to protect against the transmission of sexually transmitted infections (STIs). Talk with your health care provider about which form of contraception is most appropriate for you. Document Released: 10/11/2005 Document Revised: 10/16/2013 Document Reviewed: 04/05/2013 [employer] Patient Information 2015 [employer]. This information is not intended to replace advice given to you by your health care provider. Make sure you discuss any questions you have with your health care  provider.  Breastfeeding Deciding to breastfeed is one of the best choices you can make for you and your baby. A change in hormones during pregnancy causes your breast tissue to grow and increases the number and size of your milk ducts. These hormones also allow proteins, sugars, and fats from your blood supply to make breast milk in your milk-producing glands. Hormones prevent breast milk from being released before your baby is born as well as prompt milk flow after birth. Once breastfeeding has begun, thoughts of your baby, as well as his or her sucking or crying, can stimulate the release of milk from your milk-producing glands.  BENEFITS OF BREASTFEEDING For Your Baby  Your first milk (colostrum) helps your baby's digestive system function better.   There are antibodies in your milk that help your baby fight off infections.   Your baby has a lower incidence of asthma, allergies, and sudden infant death syndrome.   The nutrients in breast milk are better for your baby than infant formulas and are designed uniquely for your baby's needs.   Breast milk improves your baby's brain development.   Your baby is less likely to develop other conditions, such as childhood obesity, asthma, or type 2 diabetes mellitus.  For You   Breastfeeding helps to create a very special bond between you and your baby.   Breastfeeding is convenient. Breast milk is always available at the correct temperature and costs nothing.   Breastfeeding helps to burn calories and helps you lose the weight gained during pregnancy.   Breastfeeding makes your uterus contract to its prepregnancy size faster and slows bleeding (lochia) after you give birth.   Breastfeeding helps to lower your risk of developing type 2 diabetes mellitus, osteoporosis, and breast or ovarian cancer later in life. SIGNS THAT YOUR BABY IS HUNGRY Early Signs of Hunger  Increased alertness or activity.  Stretching.  Movement of the  head from side to side.  Movement of the head and opening of the mouth when the corner   of the mouth or cheek is stroked (rooting).  Increased sucking sounds, smacking lips, cooing, sighing, or squeaking.  Hand-to-mouth movements.  Increased sucking of fingers or hands. Late Signs of Hunger  Fussing.  Intermittent crying. Extreme Signs of Hunger Signs of extreme hunger will require calming and consoling before your baby will be able to breastfeed successfully. Do not wait for the following signs of extreme hunger to occur before you initiate breastfeeding:   Restlessness.  A loud, strong cry.   Screaming. BREASTFEEDING BASICS Breastfeeding Initiation  Find a comfortable place to sit or lie down, with your neck and back well supported.  Place a pillow or rolled up blanket under your baby to bring him or her to the level of your breast (if you are seated). Nursing pillows are specially designed to help support your arms and your baby while you breastfeed.  Make sure that your baby's abdomen is facing your abdomen.   Gently massage your breast. With your fingertips, massage from your chest wall toward your nipple in a circular motion. This encourages milk flow. You may need to continue this action during the feeding if your milk flows slowly.  Support your breast with 4 fingers underneath and your thumb above your nipple. Make sure your fingers are well away from your nipple and your baby's mouth.   Stroke your baby's lips gently with your finger or nipple.   When your baby's mouth is open wide enough, quickly bring your baby to your breast, placing your entire nipple and as much of the colored area around your nipple (areola) as possible into your baby's mouth.   More areola should be visible above your baby's upper lip than below the lower lip.   Your baby's tongue should be between his or her lower gum and your breast.   Ensure that your baby's mouth is correctly  positioned around your nipple (latched). Your baby's lips should create a seal on your breast and be turned out (everted).  It is common for your baby to suck about 2-3 minutes in order to start the flow of breast milk. Latching Teaching your baby how to latch on to your breast properly is very important. An improper latch can cause nipple pain and decreased milk supply for you and poor weight gain in your baby. Also, if your baby is not latched onto your nipple properly, he or she may swallow some air during feeding. This can make your baby fussy. Burping your baby when you switch breasts during the feeding can help to get rid of the air. However, teaching your baby to latch on properly is still the best way to prevent fussiness from swallowing air while breastfeeding. Signs that your baby has successfully latched on to your nipple:    Silent tugging or silent sucking, without causing you pain.   Swallowing heard between every 3-4 sucks.    Muscle movement above and in front of his or her ears while sucking.  Signs that your baby has not successfully latched on to nipple:   Sucking sounds or smacking sounds from your baby while breastfeeding.  Nipple pain. If you think your baby has not latched on correctly, slip your finger into the corner of your baby's mouth to break the suction and place it between your baby's gums. Attempt breastfeeding initiation again. Signs of Successful Breastfeeding Signs from your baby:   A gradual decrease in the number of sucks or complete cessation of sucking.   Falling asleep.     Relaxation of his or her body.   Retention of a small amount of milk in his or her mouth.   Letting go of your breast by himself or herself. Signs from you:  Breasts that have increased in firmness, weight, and size 1-3 hours after feeding.   Breasts that are softer immediately after breastfeeding.  Increased milk volume, as well as a change in milk consistency  and color by the fifth day of breastfeeding.   Nipples that are not sore, cracked, or bleeding. Signs That Your Baby is Getting Enough Milk  Wetting at least 3 diapers in a 24-hour period. The urine should be clear and pale yellow by age 5 days.  At least 3 stools in a 24-hour period by age 5 days. The stool should be soft and yellow.  At least 3 stools in a 24-hour period by age 7 days. The stool should be seedy and yellow.  No loss of weight greater than 10% of birth weight during the first 3 days of age.  Average weight gain of 4-7 ounces (113-198 g) per week after age 4 days.  Consistent daily weight gain by age 5 days, without weight loss after the age of 2 weeks. After a feeding, your baby may spit up a small amount. This is common. BREASTFEEDING FREQUENCY AND DURATION Frequent feeding will help you make more milk and can prevent sore nipples and breast engorgement. Breastfeed when you feel the need to reduce the fullness of your breasts or when your baby shows signs of hunger. This is called "breastfeeding on demand." Avoid introducing a pacifier to your baby while you are working to establish breastfeeding (the first 4-6 weeks after your baby is born). After this time you may choose to use a pacifier. Research has shown that pacifier use during the first year of a baby's life decreases the risk of sudden infant death syndrome (SIDS). Allow your baby to feed on each breast as long as he or she wants. Breastfeed until your baby is finished feeding. When your baby unlatches or falls asleep while feeding from the first breast, offer the second breast. Because newborns are often sleepy in the first few weeks of life, you may need to awaken your baby to get him or her to feed. Breastfeeding times will vary from baby to baby. However, the following rules can serve as a guide to help you ensure that your baby is properly fed:  Newborns (babies 4 weeks of age or younger) may breastfeed every  1-3 hours.  Newborns should not go longer than 3 hours during the day or 5 hours during the night without breastfeeding.  You should breastfeed your baby a minimum of 8 times in a 24-hour period until you begin to introduce solid foods to your baby at around 6 months of age. BREAST MILK PUMPING Pumping and storing breast milk allows you to ensure that your baby is exclusively fed your breast milk, even at times when you are unable to breastfeed. This is especially important if you are going back to work while you are still breastfeeding or when you are not able to be present during feedings. Your lactation consultant can give you guidelines on how long it is safe to store breast milk.  A breast pump is a machine that allows you to pump milk from your breast into a sterile bottle. The pumped breast milk can then be stored in a refrigerator or freezer. Some breast pumps are operated by hand, while others   use electricity. Ask your lactation consultant which type will work best for you. Breast pumps can be purchased, but some hospitals and breastfeeding support groups lease breast pumps on a monthly basis. A lactation consultant can teach you how to hand express breast milk, if you prefer not to use a pump.  CARING FOR YOUR BREASTS WHILE YOU BREASTFEED Nipples can become dry, cracked, and sore while breastfeeding. The following recommendations can help keep your breasts moisturized and healthy:  Avoid using soap on your nipples.   Wear a supportive bra. Although not required, special nursing bras and tank tops are designed to allow access to your breasts for breastfeeding without taking off your entire bra or top. Avoid wearing underwire-style bras or extremely tight bras.  Air dry your nipples for 3-4minutes after each feeding.   Use only cotton bra pads to absorb leaked breast milk. Leaking of breast milk between feedings is normal.   Use lanolin on your nipples after breastfeeding. Lanolin  helps to maintain your skin's normal moisture barrier. If you use pure lanolin, you do not need to wash it off before feeding your baby again. Pure lanolin is not toxic to your baby. You may also hand express a few drops of breast milk and gently massage that milk into your nipples and allow the milk to air dry. In the first few weeks after giving birth, some women experience extremely full breasts (engorgement). Engorgement can make your breasts feel heavy, warm, and tender to the touch. Engorgement peaks within 3-5 days after you give birth. The following recommendations can help ease engorgement:  Completely empty your breasts while breastfeeding or pumping. You may want to start by applying warm, moist heat (in the shower or with warm water-soaked hand towels) just before feeding or pumping. This increases circulation and helps the milk flow. If your baby does not completely empty your breasts while breastfeeding, pump any extra milk after he or she is finished.  Wear a snug bra (nursing or regular) or tank top for 1-2 days to signal your body to slightly decrease milk production.  Apply ice packs to your breasts, unless this is too uncomfortable for you.  Make sure that your baby is latched on and positioned properly while breastfeeding. If engorgement persists after 48 hours of following these recommendations, contact your health care provider or a lactation consultant. OVERALL HEALTH CARE RECOMMENDATIONS WHILE BREASTFEEDING  Eat healthy foods. Alternate between meals and snacks, eating 3 of each per day. Because what you eat affects your breast milk, some of the foods may make your baby more irritable than usual. Avoid eating these foods if you are sure that they are negatively affecting your baby.  Drink milk, fruit juice, and water to satisfy your thirst (about 10 glasses a day).   Rest often, relax, and continue to take your prenatal vitamins to prevent fatigue, stress, and  anemia.  Continue breast self-awareness checks.  Avoid chewing and smoking tobacco.  Avoid alcohol and drug use. Some medicines that may be harmful to your baby can pass through breast milk. It is important to ask your health care provider before taking any medicine, including all over-the-counter and prescription medicine as well as vitamin and herbal supplements. It is possible to become pregnant while breastfeeding. If birth control is desired, ask your health care provider about options that will be safe for your baby. SEEK MEDICAL CARE IF:   You feel like you want to stop breastfeeding or have become frustrated with breastfeeding.    You have painful breasts or nipples.  Your nipples are cracked or bleeding.  Your breasts are red, tender, or warm.  You have a swollen area on either breast.  You have a fever or chills.  You have nausea or vomiting.  You have drainage other than breast milk from your nipples.  Your breasts do not become full before feedings by the fifth day after you give birth.  You feel sad and depressed.  Your baby is too sleepy to eat well.  Your baby is having trouble sleeping.   Your baby is wetting less than 3 diapers in a 24-hour period.  Your baby has less than 3 stools in a 24-hour period.  Your baby's skin or the white part of his or her eyes becomes yellow.   Your baby is not gaining weight by 5 days of age. SEEK IMMEDIATE MEDICAL CARE IF:   Your baby is overly tired (lethargic) and does not want to wake up and feed.  Your baby develops an unexplained fever. Document Released: 10/11/2005 Document Revised: 10/16/2013 Document Reviewed: 04/04/2013 ExitCare Patient Information 2015 [employer]. This information is not intended to replace advice given to you by your health care provider. Make sure you discuss any questions you have with your health care provider.  

## 2014-05-31 LAB — OBSTETRIC PANEL
ANTIBODY SCREEN: NEGATIVE
Basophils Absolute: 0 10*3/uL (ref 0.0–0.1)
Basophils Relative: 0 % (ref 0–1)
Eosinophils Absolute: 0.1 10*3/uL (ref 0.0–0.7)
Eosinophils Relative: 1 % (ref 0–5)
HEMATOCRIT: 36.8 % (ref 36.0–46.0)
HEMOGLOBIN: 12.7 g/dL (ref 12.0–15.0)
HEP B S AG: NEGATIVE
LYMPHS PCT: 31 % (ref 12–46)
Lymphs Abs: 2.7 10*3/uL (ref 0.7–4.0)
MCH: 30.7 pg (ref 26.0–34.0)
MCHC: 34.5 g/dL (ref 30.0–36.0)
MCV: 88.9 fL (ref 78.0–100.0)
MONOS PCT: 6 % (ref 3–12)
Monocytes Absolute: 0.5 10*3/uL (ref 0.1–1.0)
NEUTROS ABS: 5.3 10*3/uL (ref 1.7–7.7)
NEUTROS PCT: 62 % (ref 43–77)
Platelets: 288 10*3/uL (ref 150–400)
RBC: 4.14 MIL/uL (ref 3.87–5.11)
RDW: 13.9 % (ref 11.5–15.5)
RUBELLA: 1.05 {index} — AB (ref <0.90)
Rh Type: POSITIVE
WBC: 8.6 10*3/uL (ref 4.0–10.5)

## 2014-05-31 LAB — CYTOLOGY - PAP

## 2014-05-31 LAB — HIV ANTIBODY (ROUTINE TESTING W REFLEX): HIV 1&2 Ab, 4th Generation: NONREACTIVE

## 2014-06-03 LAB — COCAINE METABOLITE (GC/LC/MS), URINE: BENZOYLECGONINE GC/MS CONF: 22235 ng/mL — AB (ref <100)

## 2014-06-03 LAB — OPIATES/OPIOIDS (LC/MS-MS)
CODEINE URINE: NEGATIVE ng/mL (ref <50)
HYDROMORPHONE: NEGATIVE ng/mL (ref <50)
Hydrocodone: NEGATIVE ng/mL (ref <50)
MORPHINE: NEGATIVE ng/mL (ref <50)
NORHYDROCODONE, UR: NEGATIVE ng/mL (ref <50)
Noroxycodone, Ur: 6095 ng/mL — AB (ref <50)
OXYMORPHONE, URINE: 436 ng/mL — AB (ref <50)
Oxycodone, ur: 1700 ng/mL — AB (ref <50)

## 2014-06-03 LAB — METHADONE (GC/LC/MS), URINE
EDDP (GC/LC/MS), ur confirm: 72235 ng/mL — AB (ref <100)
Methadone (GC/LC/MS), ur confirm: 37441 ng/mL — AB (ref <100)

## 2014-06-03 LAB — CANNABANOIDS (GC/LC/MS), URINE: THC-COOH (GC/LC/MS), ur confirm: 325 ng/mL — AB (ref <5)

## 2014-06-03 LAB — AMPHETAMINES (GC/LC/MS), URINE
Amphetamine GC/MS Conf: 14950 ng/mL — AB (ref <250)
Methamphetamine Quant, Ur: NEGATIVE ng/mL (ref <250)

## 2014-06-03 LAB — OXYCODONE, URINE (LC/MS-MS)
Noroxycodone, Ur: 6095 ng/mL — AB (ref <50)
OXYMORPHONE, URINE: 436 ng/mL — AB (ref <50)
Oxycodone, ur: 1700 ng/mL — AB (ref <50)

## 2014-06-04 LAB — PRESCRIPTION MONITORING PROFILE (19 PANEL)
Barbiturate Screen, Urine: NEGATIVE ng/mL
Benzodiazepine Screen, Urine: NEGATIVE ng/mL
Buprenorphine, Urine: NEGATIVE ng/mL
Carisoprodol, Urine: NEGATIVE ng/mL
Creatinine, Urine: 231.84 mg/dL (ref >20.0)
Fentanyl, Ur: NEGATIVE ng/mL
MDMA URINE: NEGATIVE ng/mL
Meperidine, Ur: NEGATIVE ng/mL
Methaqualone: NEGATIVE ng/mL
Nitrites, Initial: NEGATIVE ug/mL
PH URINE, INITIAL: 6.2 pH (ref 4.5–8.9)
PROPOXYPHENE: NEGATIVE ng/mL
Phencyclidine, Ur: NEGATIVE ng/mL
TRAMADOL UR: NEGATIVE ng/mL
Tapentadol, urine: NEGATIVE ng/mL
Zolpidem, Urine: NEGATIVE ng/mL

## 2014-06-07 ENCOUNTER — Encounter: Payer: Self-pay | Admitting: Obstetrics and Gynecology

## 2014-06-13 ENCOUNTER — Encounter: Payer: Self-pay | Admitting: *Deleted

## 2014-06-19 ENCOUNTER — Telehealth: Payer: Self-pay | Admitting: *Deleted

## 2014-06-19 NOTE — Telephone Encounter (Signed)
Called patient's contact numbers- no answer and unable to leave message as voicemails not set up.  Needed to find out if she has already taken care of this.  She has  Medicaid , can go to ortho that accepts medicaid.

## 2014-06-19 NOTE — Telephone Encounter (Signed)
Message copied by Gerome Apley on Wed Jun 19, 2014  3:21 PM ------      Message from: CONSTANT, PEGGY      Created: Thu May 30, 2014 12:46 PM       Thank you for addressing that. She did not mention it to me            Peggy      ----- Message -----         From: Simona Huh, RN         Sent: 05/30/2014  12:26 PM           To: Catalina Antigua, MD            Camellia wants to get a referral to surgeon for finger for open fracture- said ER tried , but said surgeon said ER can't refer her that her regular doctor needs to. Was referred to Dr.Christopher Coley [address] [phone]       ------

## 2014-06-21 NOTE — Telephone Encounter (Signed)
Called pt on home number and was unable to leave message as her voice mailbox is full.  Pt needs to be informed that I have made appt for her with Dr. Ronalee Belts on 9/9 @ 0930. She will need to bring her Medicaid card as well as picture ID.

## 2014-06-24 NOTE — Telephone Encounter (Addendum)
Called Fransisca's home number - unable to leave message as mailbox is full. Has 9/3 obfu appt- note made on appt to tell patient about this referral. If she does not keep her 9/3 ob fu we can mail a letter. Will leave in inbox until 9/3.

## 2014-06-25 ENCOUNTER — Encounter (HOSPITAL_COMMUNITY): Payer: Self-pay

## 2014-06-25 ENCOUNTER — Ambulatory Visit (HOSPITAL_COMMUNITY)
Admission: RE | Admit: 2014-06-25 | Discharge: 2014-06-25 | Disposition: A | Discharge status: Home / Self Care | Payer: Medicaid Other | Source: Ambulatory Visit | Attending: Obstetrics and Gynecology | Admitting: Obstetrics and Gynecology

## 2014-06-25 ENCOUNTER — Other Ambulatory Visit: Payer: Self-pay

## 2014-06-25 ENCOUNTER — Telehealth: Payer: Self-pay | Admitting: *Deleted

## 2014-06-25 ENCOUNTER — Ambulatory Visit (HOSPITAL_COMMUNITY)
Admission: RE | Admit: 2014-06-25 | Discharge: 2014-06-25 | Disposition: A | Discharge status: Home / Self Care | Payer: Medicaid Other | Source: Ambulatory Visit | Attending: Family Medicine | Admitting: Family Medicine

## 2014-06-25 ENCOUNTER — Other Ambulatory Visit: Payer: Self-pay | Admitting: Obstetrics and Gynecology

## 2014-06-25 VITALS — BP 116/41 | HR 75 | Wt 145.0 lb

## 2014-06-25 DIAGNOSIS — O09299 Supervision of pregnancy with other poor reproductive or obstetric history, unspecified trimester: Secondary | ICD-10-CM

## 2014-06-25 DIAGNOSIS — O09529 Supervision of elderly multigravida, unspecified trimester: Secondary | ICD-10-CM | POA: Diagnosis not present

## 2014-06-25 DIAGNOSIS — O99320 Drug use complicating pregnancy, unspecified trimester: Principal | ICD-10-CM

## 2014-06-25 DIAGNOSIS — O09522 Supervision of elderly multigravida, second trimester: Secondary | ICD-10-CM

## 2014-06-25 DIAGNOSIS — O34219 Maternal care for unspecified type scar from previous cesarean delivery: Secondary | ICD-10-CM | POA: Insufficient documentation

## 2014-06-25 DIAGNOSIS — O99322 Drug use complicating pregnancy, second trimester: Secondary | ICD-10-CM

## 2014-06-25 DIAGNOSIS — O99330 Smoking (tobacco) complicating pregnancy, unspecified trimester: Secondary | ICD-10-CM | POA: Diagnosis not present

## 2014-06-25 DIAGNOSIS — F1920 Other psychoactive substance dependence, uncomplicated: Secondary | ICD-10-CM | POA: Diagnosis present

## 2014-06-25 DIAGNOSIS — F17200 Nicotine dependence, unspecified, uncomplicated: Secondary | ICD-10-CM

## 2014-06-25 DIAGNOSIS — F1420 Cocaine dependence, uncomplicated: Secondary | ICD-10-CM | POA: Diagnosis not present

## 2014-06-25 DIAGNOSIS — O0990 Supervision of high risk pregnancy, unspecified, unspecified trimester: Secondary | ICD-10-CM

## 2014-06-25 DIAGNOSIS — F1120 Opioid dependence, uncomplicated: Secondary | ICD-10-CM | POA: Diagnosis not present

## 2014-06-25 DIAGNOSIS — Z3689 Encounter for other specified antenatal screening: Secondary | ICD-10-CM

## 2014-06-25 NOTE — Telephone Encounter (Signed)
Notified by MFM needs Cruzita Lederer, if patient calls need to tell her both about referral to Dr. Izora Ribas and need to do [facility] paperwork.  Will do at 9/3 appointment if no calls

## 2014-06-25 NOTE — Consult Note (Signed)
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  The patient reports that she is currently followed by [facility] treatment facility where she receives counseling due to PTSD and ADHD.  She is currently on Methadone 160 mg daily.  She has a past history of Heroin and Cocaine abuse but reports that she has been clean for "a year and a half".  She did use Marijuana earlier in pregnancy to control nausea.  Her past OB history is remarkable for a previous 32 week delivery (spontaneous preterm delivery), two previous early SABs and a C-section at 24 weeks in a pregnancy complicated by chronic abruption.  The newborn died at day of life 63.  She is without complaints today.  OB History: OB History   Grav Para Term Preterm Abortions TAB SAB Ect Mult Living        PMH:  Past Medical History  Diagnosis Date  . Adult ADHD   . COPD (chronic obstructive pulmonary disease)     PSH:  Past Surgical History  Procedure Laterality Date  . Cesarean section    . Appendectomy     Meds:  Current Outpatient Prescriptions on File Prior to Encounter  Medication Sig Dispense Refill  . albuterol (PROVENTIL HFA;VENTOLIN HFA) 108 (90 BASE) MCG/ACT inhaler Inhale 2 puffs into the lungs every 6 (six) hours as needed for wheezing or shortness of breath.       . amphetamine-dextroamphetamine (ADDERALL) 20 MG tablet Take 20 mg by mouth 3 (three) times daily.      . cephALEXin (KEFLEX) 500 MG capsule Take 1 capsule (500 mg total) by mouth 4 (four) times daily.  40 capsule  0  . docusate sodium (COLACE) 100 MG capsule Take 1 capsule (100 mg total) by mouth 2 (two) times daily as  needed.  30 capsule  2  . methadone (DOLOPHINE) 10 MG/5ML solution Take 160 mg by mouth every 6 (six) hours as needed for pain.      Marland Kitchen ondansetron (ZOFRAN ODT) 4 MG disintegrating tablet Take 1 tablet (4 mg total) by mouth every 6 (six) hours as needed for nausea.  20 tablet  0  . oxyCODONE-acetaminophen (PERCOCET/ROXICET) 5-325 MG per tablet Take 1 tablet by mouth every 4 (four) hours as needed.  20 tablet  0   No current facility-administered medications on file prior to encounter.   Allergies:  Allergies  Allergen Reactions  . Phenergan [Promethazine Hcl]     Legs shaking    FH:  Family History  Problem Relation Age of Onset  . Hyperlipidemia Father   . Heart disease Father   . Hypertension Father   . Stroke Father   . Heart disease Brother   . Hyperlipidemia Brother   . Hypertension Brother    Soc:  History   Social History  . Marital Status: Single    Spouse Name: N/A    Number of Children: N/A  . Years of Education: N/A   Occupational History  . Not on file.   Social History Main Topics  . Smoking status: Current Every Day Smoker -- 0.50 packs/day  . Smokeless tobacco:  Never Used  . Alcohol Use: No  . Drug Use: Yes    Special: Marijuana, "Crack" cocaine     Comment: former herion user and marijuana and cocaine; stopped two weeks after found out she was prengnat in MAY 2015  . Sexual Activity: Yes    Birth Control/ Protection: Injection   Other Topics Concern  . Not on file   Social History Narrative  . No narrative on file    PE:  116/41, 75, 145#  GEN: well-appearing female ABD: gravid, NT  Please see separate document for fetal ultrasound report.   A/P: 1) Single IUP at 19w 1d         2) Hx of cocaine, Heroin use - currently on Methadone - discussed risks of neonatal withdrawal - aware that the newborn may require prolonged hospitalization for narcotic withdrawal.  Also discussed the importance of avoiding cocaine and smoking due to her personal  history of a previous abruption.  She reports that her clinic is trying to go down on her Methadone use - decreasing by 5 mg daily each week.  I also explained that we would like to try to avoid withdrawal during pregnancy which may have some untoward effects with the pregnancy.         3) Tobacco abuse         4) Hx of preterm delivery at 32 weeks - patient is a candidate for 17-P injections.  Recommend initiating this as soon as the medications are available - will discuss at next clinic visit        5) Advanced maternal age - see note from [facility] - elected to undergo Cell free fetal DNA testing        6) Hx of chronic abruption and cesarean delivery at 24 weeks   Recommend: 1) Please try to obtain operative note from 24 week cesarean delivery.  The patient would like to try to have a vaginal delivery, would be very hesitant to offer this without documentation of a prior lower transverse uterine incision 2) 17-P injections due to history of prior 32 week delivery 3) Follow up ultrasound in 4 weeks for growth / anatomy.  Will need serial growth scans in the 3rd trimester given the patient's history 4) Recommend antepartum fetal testing beginning at 32 weeks given poor obstetrical history 5) Continue to follow up with Methadone clinic and psychologist due to ADHD and PTSD   Thank you for the opportunity to be a part of the care of [name]. Please contact our office if we can be of further assistance.   I spent approximately 30 minutes with this patient with over 50% of time spent in face-to-face counseling.  Paige Gula, MD Maternal Fetal Medicine

## 2014-06-25 NOTE — Telephone Encounter (Signed)
Received a call from MFM that they saw her today for a consult and they recommend she start makena- I informed them she needs to come to fill out paperwork and then we will start giving the injections when the medicine arrives. She states Ms. Wurth has already left today, but has an appointment this week with [place] on 9/ 3/15.  We will do paperwork then.

## 2014-06-25 NOTE — ED Notes (Signed)
Prescription for Zofran ODT , every 6 hours as needed for nausea, per Dr. Claudean Severance called to [facility] on [address].

## 2014-06-27 ENCOUNTER — Ambulatory Visit (INDEPENDENT_AMBULATORY_CARE_PROVIDER_SITE_OTHER): Payer: Medicaid Other | Admitting: Family Medicine

## 2014-06-27 VITALS — BP 105/46 | HR 63 | Temp 98.1°F | Wt 145.3 lb

## 2014-06-27 DIAGNOSIS — R12 Heartburn: Secondary | ICD-10-CM

## 2014-06-27 DIAGNOSIS — F1120 Opioid dependence, uncomplicated: Secondary | ICD-10-CM | POA: Insufficient documentation

## 2014-06-27 DIAGNOSIS — O26892 Other specified pregnancy related conditions, second trimester: Secondary | ICD-10-CM

## 2014-06-27 DIAGNOSIS — O9989 Other specified diseases and conditions complicating pregnancy, childbirth and the puerperium: Secondary | ICD-10-CM

## 2014-06-27 MED ORDER — OMEPRAZOLE 20 MG PO CPDR: 20.0000 mg | DELAYED_RELEASE_CAPSULE | Freq: Two times a day (BID) | ORAL | Status: DC

## 2014-06-27 NOTE — Progress Notes (Signed)
Has been trying to decrease dose of methadone.  I discussed with her that she shouldn't wean off medication while pregnancy, due to the withdrawal symptoms and risk of preterm labor.  Start 17-P today.  Also having heartburn.  Has tried Zantac in the past, which didn't work.  Will prescribe Omeprazole.  No other problems.  UDS positive for multiple things: marijuana, cocaine metabolites, oxycodone in addition to methadone and adderall.  Referred to substance abuse counselor.

## 2014-06-27 NOTE — Progress Notes (Signed)
Patient reports pain in stomach that sometimes takes her breath away and she cannot even move, thinks maybe it is constipation; also reports extreme tiredness, has trouble staying awake and even falls asleep while driving; information about referral given

## 2014-06-27 NOTE — Telephone Encounter (Signed)
Skylah came to ob appointment today, was given referral information by check in nurse. Was sent to complete [facility] paperwork.

## 2014-06-27 NOTE — Progress Notes (Signed)
Genetic Counseling  High-Risk Gestation Note  Appointment Date:  06/25/2014 Referred By: Catalina Antigua, MD Date of Birth:  1976-03-29    Pregnancy History: [id number] Estimated Date of Delivery: 11/18/14 Estimated Gestational Age: [redacted]w[redacted]d Attending: Particia Nearing, MD   Ms. Ameria Sanjurjo was seen for genetic counseling because of a maternal age of 38 y.o.. [employer] Genetic Counseling Intern, Vance Peper, assisted with genetic counseling under my direct supervision.     She was counseled regarding maternal age and the association with risk for chromosome conditions due to nondisjunction with aging of the ova.  We reviewed chromosomes, nondisjunction, and the associated 1 in 62 risk for fetal aneuploidy related to a maternal age of 38 y.o. at [redacted]w[redacted]d gestation.  She was counseled that the risk for aneuploidy decreases as gestational age increases, accounting for those pregnancies which spontaneously abort.  We specifically discussed Down syndrome (trisomy 94), trisomies 73 and 77, and sex chromosome aneuploidies (47,XXX and 47,XXY) including the common features and prognoses of each.   We reviewed available screening options including Quad screen, noninvasive prenatal screening (NIPS)/cell free DNA (cfDNA) testing, and detailed ultrasound.  She was counseled that screening tests are used to modify a patient's a priori risk for aneuploidy, typically based on age. This estimate provides a pregnancy specific risk assessment. We reviewed the benefits and limitations of each option. Specifically, we discussed the conditions for which each test screens, the detection rates, and false positive rates of each. She was also counseled regarding diagnostic testing via amniocentesis. We reviewed the approximate 1 in 300-500 risk for complications for amniocentesis, including spontaneous pregnancy loss.   We reviewed the targeted ultrasound that was performed today.  The ultrasound report will be sent under separate cover.  There were no visualized fetal anomalies or markers suggestive of aneuploidy.  After consideration of all the options, she elected to proceed with NIPS (Panorama) at the time of today's visit.  Those results will be available in 8-10 days. She declined amniocentesis in the pregnancy.  She understands that screening tests cannot rule out all birth defects or genetic syndromes.   Ms. Kenlynn Houde was provided with written information regarding cystic fibrosis (CF) including the carrier frequency and incidence in the Caucasian population, the availability of carrier testing and prenatal diagnosis if indicated.  In addition, we discussed that CF is routinely screened for as part of the [place] newborn screening panel.  She declined testing today and stated that she possibly had this performed in the past.   Both family histories were reviewed and found to be contributory for Asperger syndrome for the patient's 52 year old son with a previous partner. We discussed that Asperger syndrome is part of the spectrum of conditions referred to as Autistic spectrum disorders (ASD). We discussed that ASDs are among the most common neurodevelopmental disorders, with approximately 1 in 88 children meeting criteria for ASD. Approximately 80% of individuals diagnosed are female. There is strong evidence that genetic factors play a critical role in development of ASD. There have been recent advances in identifying specific genetic causes of ASD, however, there are still many individuals for whom the etiology of the ASD is not known. Once a family has a child with a diagnosis of ASD, there is a 13.5% chance to have another child with ASD. If the pregnancy is female the chance is approximately 9%, and approximately 26% if the pregnancy is female. When there is more than one affected sibling, the recurrence chance is 32%. We discussed that  recurrence risk data are limited for extended degree relatives, but recurrence risk would be expected to  be lower for a half-sibling (second degree relative) than that of a first degree relative. They understand that at this time there is not prenatal genetic testing or screening available for ASD for most families. Without further information regarding the provided family history, an accurate genetic risk cannot be calculated. Further genetic counseling is warranted if more information is obtained.  Ms. Teea Miao denied exposure to environmental toxins or chemical agents. She denied the use of alcohol. She reported smoking approximately one pack of cigarettes per day. The associations of smoking in pregnancy were reviewed and cessation encouraged. She reported smoking marijuana earlier in pregnancy to control nausea but reported that she has discontinued this and is taking zofran. Prior to reviewing the specific exposures, we reviewed the general principles of teratogenic agents. Every pregnancy carries the risk for congenital anomalies of approximately 3-5%. To show that an agent is teratogenic, the rate of abnormalities for exposed pregnancies must be greater than that expected due to the background risk.  The dosage and timing of an exposure are also very important, with the first trimester of pregnancy being the most critical. We reviewed that available data regarding recreational use of marijuana in pregnancy have not indicated an association with increased risk for birth defects. Some studies have indicated a possible association with prenatal marijuana use and decreased fetal growth. Given this association, we discussed that no marijuana use in pregnancy is recommended. Ms. Nakesha Whittington denied additional illicit drug use in the pregnancy. She reported that she is currently taking methadone. Please see separate MFM consult note from today's visit regarding detailed discussion of this history. She denied significant viral illnesses during the course of her pregnancy. Her medical and surgical histories were  contributory for history of reportedly emergency c-section performed in her most recent pregnancy due to placental abruption, and her son lived 21 days. See separate MFM note for additional discussion.    I counseled Ms. Naaman Plummer regarding the above risks and available options.  The approximate face-to-face time with the genetic counselor was 40 minutes.  Quinn Plowman, MS,  Certified Genetic Counselor 06/27/2014

## 2014-06-27 NOTE — Patient Instructions (Signed)
Second Trimester of Pregnancy The second trimester is from week 13 through week 28, months 4 through 6. The second trimester is often a time when you feel your best. Your body has also adjusted to being pregnant, and you begin to feel better physically. Usually, morning sickness has lessened or quit completely, you may have more energy, and you may have an increase in appetite. The second trimester is also a time when the fetus is growing rapidly. At the end of the sixth month, the fetus is about 9 inches long and weighs about 1 pounds. You will likely begin to feel the baby move (quickening) between 18 and 20 weeks of the pregnancy. BODY CHANGES Your body goes through many changes during pregnancy. The changes vary from woman to woman.   Your weight will continue to increase. You will notice your lower abdomen bulging out.  You may begin to get stretch marks on your hips, abdomen, and breasts.  You may develop headaches that can be relieved by medicines approved by your health care provider.  You may urinate more often because the fetus is pressing on your bladder.  You may develop or continue to have heartburn as a result of your pregnancy.  You may develop constipation because certain hormones are causing the muscles that push waste through your intestines to slow down.  You may develop hemorrhoids or swollen, bulging veins (varicose veins).  You may have back pain because of the weight gain and pregnancy hormones relaxing your joints between the bones in your pelvis and as a result of a shift in weight and the muscles that support your balance.  Your breasts will continue to grow and be tender.  Your gums may bleed and may be sensitive to brushing and flossing.  Dark spots or blotches (chloasma, mask of pregnancy) may develop on your face. This will likely fade after the baby is born.  A dark line from your belly button to the pubic area (linea nigra) may appear. This will likely fade  after the baby is born.  You may have changes in your hair. These can include thickening of your hair, rapid growth, and changes in texture. Some women also have hair loss during or after pregnancy, or hair that feels dry or thin. Your hair will most likely return to normal after your baby is born. WHAT TO EXPECT AT YOUR PRENATAL VISITS During a routine prenatal visit:  You will be weighed to make sure you and the fetus are growing normally.  Your blood pressure will be taken.  Your abdomen will be measured to track your baby's growth.  The fetal heartbeat will be listened to.  Any test results from the previous visit will be discussed. Your health care provider may ask you:  How you are feeling.  If you are feeling the baby move.  If you have had any abnormal symptoms, such as leaking fluid, bleeding, severe headaches, or abdominal cramping.  If you have any questions. Other tests that may be performed during your second trimester include:  Blood tests that check for:  Low iron levels (anemia).  Gestational diabetes (between 24 and 28 weeks).  Rh antibodies.  Urine tests to check for infections, diabetes, or protein in the urine.  An ultrasound to confirm the proper growth and development of the baby.  An amniocentesis to check for possible genetic problems.  Fetal screens for spina bifida and Down syndrome. HOME CARE INSTRUCTIONS   Avoid all smoking, herbs, alcohol, and unprescribed   drugs. These chemicals affect the formation and growth of the baby.  Follow your health care provider's instructions regarding medicine use. There are medicines that are either safe or unsafe to take during pregnancy.  Exercise only as directed by your health care provider. Experiencing uterine cramps is a good sign to stop exercising.  Continue to eat regular, healthy meals.  Wear a good support bra for breast tenderness.  Do not use hot tubs, steam rooms, or saunas.  Wear your  seat belt at all times when driving.  Avoid raw meat, uncooked cheese, cat litter boxes, and soil used by cats. These carry germs that can cause birth defects in the baby.  Take your prenatal vitamins.  Try taking a stool softener (if your health care provider approves) if you develop constipation. Eat more high-fiber foods, such as fresh vegetables or fruit and whole grains. Drink plenty of fluids to keep your urine clear or pale yellow.  Take warm sitz baths to soothe any pain or discomfort caused by hemorrhoids. Use hemorrhoid cream if your health care provider approves.  If you develop varicose veins, wear support hose. Elevate your feet for 15 minutes, 3-4 times a day. Limit salt in your diet.  Avoid heavy lifting, wear low heel shoes, and practice good posture.  Rest with your legs elevated if you have leg cramps or low back pain.  Visit your dentist if you have not gone yet during your pregnancy. Use a soft toothbrush to brush your teeth and be gentle when you floss.  A sexual relationship may be continued unless your health care provider directs you otherwise.  Continue to go to all your prenatal visits as directed by your health care provider. SEEK MEDICAL CARE IF:   You have dizziness.  You have mild pelvic cramps, pelvic pressure, or nagging pain in the abdominal area.  You have persistent nausea, vomiting, or diarrhea.  You have a bad smelling vaginal discharge.  You have pain with urination. SEEK IMMEDIATE MEDICAL CARE IF:   You have a fever.  You are leaking fluid from your vagina.  You have spotting or bleeding from your vagina.  You have severe abdominal cramping or pain.  You have rapid weight gain or loss.  You have shortness of breath with chest pain.  You notice sudden or extreme swelling of your face, hands, ankles, feet, or legs.  You have not felt your baby move in over an hour.  You have severe headaches that do not go away with  medicine.  You have vision changes. Document Released: 10/05/2001 Document Revised: 10/16/2013 Document Reviewed: 12/12/2012 ExitCare Patient Information 2015 [employer]. This information is not intended to replace advice given to you by your health care provider. Make sure you discuss any questions you have with your health care provider.  

## 2014-06-28 ENCOUNTER — Telehealth: Payer: Self-pay | Admitting: *Deleted

## 2014-06-28 NOTE — Telephone Encounter (Signed)
Message left by [facility] from [facility] stating that pt's 17P requires pre-authorization. She faxed a pre-fill form to our office. Please call back if questions to [phone], option #1.

## 2014-07-02 ENCOUNTER — Encounter: Payer: Self-pay | Admitting: General Practice

## 2014-07-03 NOTE — Telephone Encounter (Signed)
Mayra Neer received call from [place] with [facility] and stated that the patient's insurance was going to reach out to clinic with pre-authorization form; gave insurance company's number [phone] should we have any questions we can direct them towards the insurance company. [employer] at [phone] and spoke to pharmacy representative-- was informed that patient has not had coverage since April. States patient was active for 5 months but no longer has coverage. Also states patient has attempted to run several claims since April and they have been denied. Attempted to call patient to discuss coverage information. No answer on mobile phone-- no voicemail set up-- unable to leave message. Attempted home phone-- no answer-- mailbox full-- unable to leave message. Emergency contact number same as mobile number. Will attempt to call again later.   1600: Discussed patient with Mayra Neer, who reviewed patient's insurance information. Patient is active with medicaid, though we do not have her card in our system. Medicaid number retrieved, Makena Application edited with correct insurance information and re-faxed to [facility]. Called [facility] and spoke to [place] to explain situation with [employer] and asked if Cruzita Lederer had actually reached out to company and if so why they were not informed patient is no longer insured by them. Skylar states they do typically call the insurance company-- transferred me to employee Duanna who had been working on patient case. Josepha Pigg stated she did call [employer] on 06/27/14 and was not told patient's insurance had expired. She is unsure of why she was not informed. Informed Duanna that I had re-faxed 17P form with patient's current Medicaid information. Also verbally gave [place] patient's insurance information-- she stated she would process that now and call clinic with an update.

## 2014-07-03 NOTE — Telephone Encounter (Addendum)
Called [facility] and was required to leave a voicemail. Left message stating we received a call from [facility] regarding patient and the need for a pre-authorization-- we have not received a pre-fill form-- please call our clinic so that we may have more information regarding your request.   1353: [facility] and spoke to [place], who stated a form would be re-faxed to clinic for prior authorization. Advised to fill out form and fax back to [facility] along with office notes with evidence that medication is needed. Gabriel Rung stated they would then fax information to insurance company and check up with them every 48 hours until medication is approved.

## 2014-07-04 ENCOUNTER — Telehealth (HOSPITAL_COMMUNITY): Payer: Self-pay | Admitting: MS"

## 2014-07-04 ENCOUNTER — Ambulatory Visit: Payer: Medicaid Other

## 2014-07-04 NOTE — Telephone Encounter (Signed)
Attempted to call patient regarding cell free DNA testing/noninvasive prenatal screening (NIPS) performed and that no results were able to be obtained. Patient did not answer and unable to leave a message on her phone. The patient instructed [place] to call her husband if she does not answer given that her voicemail is not set up.   Also called the number listed for her husband, Golden Pop. He did not answer, and his phone states that his voicemailbox is not activated. Unable to leave a message for the patient.   Clydie Braun Jaidon Ellery 07/04/2014 4:11 PM

## 2014-07-05 ENCOUNTER — Telehealth (HOSPITAL_COMMUNITY): Payer: Self-pay | Admitting: MS"

## 2014-07-05 NOTE — Telephone Encounter (Signed)
Attempted to call patient regarding no results obtained from NIPS (Panorama). Her voicemail is full and father of the pregnancy's phone number does not have voicemail set up.   Paige Bailey Paige Bailey 07/05/2014 2:25 PM

## 2014-07-08 NOTE — Telephone Encounter (Signed)
Called Paige Bailey to check on process of obtaining 17P-- spoke to [place]. Informed me an emergency vial of 17P was approved and pharmacy will ship it out tomorrow. Informed me it is likely we will have 17P vial in clinic by end of week.

## 2014-07-11 ENCOUNTER — Ambulatory Visit (INDEPENDENT_AMBULATORY_CARE_PROVIDER_SITE_OTHER): Payer: Medicaid Other | Admitting: General Practice

## 2014-07-11 DIAGNOSIS — O09212 Supervision of pregnancy with history of pre-term labor, second trimester: Secondary | ICD-10-CM

## 2014-07-11 DIAGNOSIS — O09219 Supervision of pregnancy with history of pre-term labor, unspecified trimester: Secondary | ICD-10-CM

## 2014-07-11 MED ORDER — HYDROXYPROGESTERONE CAPROATE 250 MG/ML IM OIL
250.0000 mg | TOPICAL_OIL | Freq: Once | INTRAMUSCULAR | Status: AC
  Administered 2014-07-11: 250 mg via INTRAMUSCULAR

## 2014-07-11 NOTE — Progress Notes (Signed)
Patient was very anxious about 17p injections. Patient met with Cardell Peach, SW for 45 minutes prior to injection and states she was fine to receive the shot this time and would try it at least once. Medication given

## 2014-07-12 ENCOUNTER — Encounter: Payer: Self-pay | Admitting: Obstetrics and Gynecology

## 2014-07-12 DIAGNOSIS — O4440 Low lying placenta NOS or without hemorrhage, unspecified trimester: Secondary | ICD-10-CM | POA: Insufficient documentation

## 2014-07-17 ENCOUNTER — Other Ambulatory Visit: Payer: Self-pay | Admitting: Obstetrics and Gynecology

## 2014-07-17 DIAGNOSIS — Z8751 Personal history of pre-term labor: Secondary | ICD-10-CM

## 2014-07-17 DIAGNOSIS — F1920 Other psychoactive substance dependence, uncomplicated: Secondary | ICD-10-CM

## 2014-07-17 DIAGNOSIS — O34219 Maternal care for unspecified type scar from previous cesarean delivery: Secondary | ICD-10-CM

## 2014-07-17 DIAGNOSIS — F1410 Cocaine abuse, uncomplicated: Secondary | ICD-10-CM

## 2014-07-17 DIAGNOSIS — O09299 Supervision of pregnancy with other poor reproductive or obstetric history, unspecified trimester: Secondary | ICD-10-CM

## 2014-07-17 DIAGNOSIS — F1120 Opioid dependence, uncomplicated: Secondary | ICD-10-CM

## 2014-07-17 DIAGNOSIS — O09522 Supervision of elderly multigravida, second trimester: Secondary | ICD-10-CM

## 2014-07-17 DIAGNOSIS — O0990 Supervision of high risk pregnancy, unspecified, unspecified trimester: Secondary | ICD-10-CM

## 2014-07-17 DIAGNOSIS — IMO0002 Reserved for concepts with insufficient information to code with codable children: Secondary | ICD-10-CM

## 2014-07-17 DIAGNOSIS — O99320 Drug use complicating pregnancy, unspecified trimester: Secondary | ICD-10-CM

## 2014-07-19 ENCOUNTER — Ambulatory Visit: Payer: Medicaid Other

## 2014-07-23 ENCOUNTER — Encounter (HOSPITAL_COMMUNITY): Payer: Self-pay

## 2014-07-23 ENCOUNTER — Ambulatory Visit (HOSPITAL_COMMUNITY)
Admission: RE | Admit: 2014-07-23 | Discharge: 2014-07-23 | Disposition: A | Discharge status: Home / Self Care | Payer: Medicaid Other | Source: Ambulatory Visit | Attending: Obstetrics & Gynecology | Admitting: Obstetrics & Gynecology

## 2014-07-23 ENCOUNTER — Other Ambulatory Visit: Payer: Self-pay

## 2014-07-23 VITALS — BP 132/84 | HR 81 | Wt 148.5 lb

## 2014-07-23 DIAGNOSIS — F1920 Other psychoactive substance dependence, uncomplicated: Secondary | ICD-10-CM | POA: Diagnosis not present

## 2014-07-23 DIAGNOSIS — F1410 Cocaine abuse, uncomplicated: Secondary | ICD-10-CM

## 2014-07-23 DIAGNOSIS — O09299 Supervision of pregnancy with other poor reproductive or obstetric history, unspecified trimester: Secondary | ICD-10-CM | POA: Diagnosis not present

## 2014-07-23 DIAGNOSIS — O99322 Drug use complicating pregnancy, second trimester: Secondary | ICD-10-CM

## 2014-07-23 DIAGNOSIS — O09522 Supervision of elderly multigravida, second trimester: Secondary | ICD-10-CM

## 2014-07-23 DIAGNOSIS — O99340 Other mental disorders complicating pregnancy, unspecified trimester: Secondary | ICD-10-CM | POA: Insufficient documentation

## 2014-07-23 DIAGNOSIS — IMO0002 Reserved for concepts with insufficient information to code with codable children: Secondary | ICD-10-CM

## 2014-07-23 DIAGNOSIS — O09529 Supervision of elderly multigravida, unspecified trimester: Secondary | ICD-10-CM | POA: Diagnosis not present

## 2014-07-23 DIAGNOSIS — Z8751 Personal history of pre-term labor: Secondary | ICD-10-CM | POA: Diagnosis not present

## 2014-07-23 DIAGNOSIS — O34219 Maternal care for unspecified type scar from previous cesarean delivery: Secondary | ICD-10-CM | POA: Insufficient documentation

## 2014-07-23 DIAGNOSIS — O99320 Drug use complicating pregnancy, unspecified trimester: Secondary | ICD-10-CM

## 2014-07-23 DIAGNOSIS — F1120 Opioid dependence, uncomplicated: Secondary | ICD-10-CM | POA: Insufficient documentation

## 2014-07-23 DIAGNOSIS — O0990 Supervision of high risk pregnancy, unspecified, unspecified trimester: Secondary | ICD-10-CM

## 2014-07-25 ENCOUNTER — Ambulatory Visit (INDEPENDENT_AMBULATORY_CARE_PROVIDER_SITE_OTHER): Payer: Medicaid Other | Admitting: *Deleted

## 2014-07-25 DIAGNOSIS — O09212 Supervision of pregnancy with history of pre-term labor, second trimester: Secondary | ICD-10-CM

## 2014-07-25 DIAGNOSIS — O09892 Supervision of other high risk pregnancies, second trimester: Secondary | ICD-10-CM

## 2014-07-25 MED ORDER — HYDROXYPROGESTERONE CAPROATE 250 MG/ML IM OIL
250.0000 mg | TOPICAL_OIL | INTRAMUSCULAR | Status: DC
  Administered 2014-07-25 – 2014-10-10 (×9): 250 mg via INTRAMUSCULAR

## 2014-07-25 MED ORDER — HYDROXYPROGESTERONE CAPROATE 250 MG/ML IM OIL: 250.0000 mg | TOPICAL_OIL | Freq: Once | INTRAMUSCULAR | Status: DC

## 2014-07-30 ENCOUNTER — Telehealth (HOSPITAL_COMMUNITY): Payer: Self-pay | Admitting: MS"

## 2014-07-30 NOTE — Telephone Encounter (Signed)
Attempted to call Ms. Paige Bailey regarding normal noninvasive prenatal screening (NIPS). Her fiance answered the phone and stated that she was not home at that time. He provided her cell phone number for us to contact her.   Clydie BraunKaren Bird Swetz 07/30/2014 3:12 PM    Called patient on mobile number. Voice mailbox was full. Unable to leave message for patient.   Clydie BraunKaren Jc Veron 07/30/2014 3:13 PM

## 2014-07-30 NOTE — Telephone Encounter (Signed)
Called Paige Bailey to discuss her cell free fetal DNA test results.  Ms. Paige Bailey had Panorama testing through [facility].  Testing was offered because of maternal age.   The patient was identified by name and DOB.  We reviewed that these are within normal limits, showing a less than 1 in 10,000 risk for trisomies 21, 18 and 13, and monosomy X (Turner syndrome).  In addition, the risk for triploidy/vanishing twin and sex chromosome trisomies (47,XXX and 47,XXY) was also low risk. We reviewed that this testing identifies > 99% of pregnancies with trisomy 2121, trisomy 5713, sex chromosome trisomies (47,XXX and 47,XXY), and triploidy. The detection rate for trisomy 18 is 96%.  The detection rate for monosomy X is ~92%.  The false positive rate is <0.1% for all conditions. Testing was also consistent with female fetal sex.  The patient did wish to know fetal sex.  She understands that this testing does not identify all genetic conditions.  All questions were answered to her satisfaction, she was encouraged to call with additional questions or concerns.  Quinn PlowmanKaren Chery Giusto, MS Certified Genetic Counselor 07/30/2014 4:23 PM

## 2014-08-01 ENCOUNTER — Ambulatory Visit (INDEPENDENT_AMBULATORY_CARE_PROVIDER_SITE_OTHER): Payer: Medicaid Other | Admitting: Obstetrics & Gynecology

## 2014-08-01 VITALS — BP 119/58 | HR 75 | Temp 98.3°F | Wt 150.6 lb

## 2014-08-01 DIAGNOSIS — Z23 Encounter for immunization: Secondary | ICD-10-CM

## 2014-08-01 DIAGNOSIS — O99322 Drug use complicating pregnancy, second trimester: Secondary | ICD-10-CM

## 2014-08-01 DIAGNOSIS — O99320 Drug use complicating pregnancy, unspecified trimester: Secondary | ICD-10-CM

## 2014-08-01 DIAGNOSIS — R12 Heartburn: Secondary | ICD-10-CM

## 2014-08-01 DIAGNOSIS — O3421 Maternal care for scar from previous cesarean delivery: Secondary | ICD-10-CM

## 2014-08-01 DIAGNOSIS — O4410 Placenta previa with hemorrhage, unspecified trimester: Secondary | ICD-10-CM

## 2014-08-01 DIAGNOSIS — O26892 Other specified pregnancy related conditions, second trimester: Secondary | ICD-10-CM

## 2014-08-01 DIAGNOSIS — O09212 Supervision of pregnancy with history of pre-term labor, second trimester: Secondary | ICD-10-CM

## 2014-08-01 DIAGNOSIS — O4440 Low lying placenta NOS or without hemorrhage, unspecified trimester: Secondary | ICD-10-CM

## 2014-08-01 DIAGNOSIS — F1121 Opioid dependence, in remission: Secondary | ICD-10-CM

## 2014-08-01 DIAGNOSIS — O34219 Maternal care for unspecified type scar from previous cesarean delivery: Secondary | ICD-10-CM

## 2014-08-01 DIAGNOSIS — F1120 Opioid dependence, uncomplicated: Secondary | ICD-10-CM

## 2014-08-01 DIAGNOSIS — O0992 Supervision of high risk pregnancy, unspecified, second trimester: Secondary | ICD-10-CM

## 2014-08-01 DIAGNOSIS — O09522 Supervision of elderly multigravida, second trimester: Secondary | ICD-10-CM

## 2014-08-01 LAB — POCT URINALYSIS DIP (DEVICE)
Bilirubin Urine: NEGATIVE
Glucose, UA: NEGATIVE mg/dL
Hgb urine dipstick: NEGATIVE
KETONES UR: NEGATIVE mg/dL
Leukocytes, UA: NEGATIVE
Nitrite: NEGATIVE
Protein, ur: NEGATIVE mg/dL
Specific Gravity, Urine: 1.02 (ref 1.005–1.030)
Urobilinogen, UA: 1 mg/dL (ref 0.0–1.0)
pH: 7 (ref 5.0–8.0)

## 2014-08-01 MED ORDER — DOCUSATE SODIUM 100 MG PO CAPS: 100.0000 mg | ORAL_CAPSULE | Freq: Two times a day (BID) | ORAL | Status: DC | PRN

## 2014-08-01 MED ORDER — OMEPRAZOLE 40 MG PO CPDR: 40.0000 mg | DELAYED_RELEASE_CAPSULE | Freq: Two times a day (BID) | ORAL | Status: DC

## 2014-08-01 MED ORDER — ONDANSETRON 4 MG PO TBDP: 4.0000 mg | ORAL_TABLET | Freq: Four times a day (QID) | ORAL | Status: DC | PRN

## 2014-08-01 NOTE — Progress Notes (Signed)
Refills ordered for patient at requested No pain currently, if pain persists, patient to go to [facility] Emergency c-section at 25 weeks secondary to placental abruption - on review of [facility] [employer] [place] [place], patient had a emergency high transverse  cesarean section for placental abruption.  She was told that she will never be allowed to labor again. Hence will treat this like a classical cesarean section; delivery at 36 weeks (October 21, 2014). WIll continue weekly 17P.  Will also get flu vaccine today. 1 hr GTT and third trimester labs next appointment; patient reports not being able to do 1 hr GTT in previous pregnancy as she vomited the drink. Also tried jelly beans which she also vomited. If this happens this time, consider fasting glucose +/- HgA1C for screening. Patient advised to take Zofran prior to next appointment. No other complaints or concerns.  Routine obstetric precautions reviewed.

## 2014-08-01 NOTE — Patient Instructions (Signed)
Return to clinic for any obstetric concerns or go to [facility] for evaluation  

## 2014-08-01 NOTE — Progress Notes (Signed)
Requests Prilosec (requests something stronger), Zofran, Colace refills.  Reports occasional RUQ abdominal pain-- reports it feels sharp and intense when it happens and has occurred twice.

## 2014-08-05 LAB — METHADONE (GC/LC/MS), URINE
EDDPUC: 58287 ng/mL — AB (ref <100)
Methadone (GC/LC/MS), ur confirm: 4792 ng/mL — AB (ref <100)

## 2014-08-05 LAB — CANNABANOIDS (GC/LC/MS), URINE: THC-COOH UR CONFIRM: 105 ng/mL — AB (ref <5)

## 2014-08-05 LAB — AMPHETAMINES (GC/LC/MS), URINE
Amphetamine GC/MS Conf: 1825 ng/mL — AB (ref <250)
Methamphetamine Quant, Ur: NEGATIVE ng/mL (ref <250)

## 2014-08-06 LAB — PRESCRIPTION MONITORING PROFILE (19 PANEL)
Barbiturate Screen, Urine: NEGATIVE ng/mL
Benzodiazepine Screen, Urine: NEGATIVE ng/mL
Buprenorphine, Urine: NEGATIVE ng/mL
CREATININE, URINE: 97.56 mg/dL (ref >20.0)
Carisoprodol, Urine: NEGATIVE ng/mL
Cocaine Metabolites: NEGATIVE ng/mL
ECSTASY: NEGATIVE ng/mL
FENTANYL URINE: NEGATIVE ng/mL
MEPERIDINE UR: NEGATIVE ng/mL
Methaqualone: NEGATIVE ng/mL
Nitrites, Initial: NEGATIVE ug/mL
OXYCODONE SCRN UR: NEGATIVE ng/mL
Opiate Screen, Urine: NEGATIVE ng/mL
PH URINE, INITIAL: 7.4 pH (ref 4.5–8.9)
Phencyclidine, Ur: NEGATIVE ng/mL
Propoxyphene: NEGATIVE ng/mL
TRAMADOL UR: NEGATIVE ng/mL
Tapentadol, urine: NEGATIVE ng/mL
Zolpidem, Urine: NEGATIVE ng/mL

## 2014-08-07 ENCOUNTER — Encounter: Payer: Self-pay | Admitting: Obstetrics & Gynecology

## 2014-08-07 ENCOUNTER — Encounter: Payer: Self-pay | Admitting: *Deleted

## 2014-08-08 ENCOUNTER — Ambulatory Visit (INDEPENDENT_AMBULATORY_CARE_PROVIDER_SITE_OTHER): Payer: Medicaid Other | Admitting: *Deleted

## 2014-08-08 VITALS — BP 130/61 | HR 82 | Temp 97.7°F | Wt 151.1 lb

## 2014-08-08 DIAGNOSIS — O09212 Supervision of pregnancy with history of pre-term labor, second trimester: Secondary | ICD-10-CM

## 2014-08-08 NOTE — Progress Notes (Signed)
17P injections

## 2014-08-15 ENCOUNTER — Other Ambulatory Visit (HOSPITAL_COMMUNITY): Payer: Self-pay | Admitting: Maternal and Fetal Medicine

## 2014-08-15 ENCOUNTER — Ambulatory Visit: Payer: Medicaid Other

## 2014-08-15 DIAGNOSIS — F1120 Opioid dependence, uncomplicated: Secondary | ICD-10-CM

## 2014-08-22 ENCOUNTER — Ambulatory Visit (INDEPENDENT_AMBULATORY_CARE_PROVIDER_SITE_OTHER): Payer: Medicaid Other | Admitting: *Deleted

## 2014-08-22 VITALS — BP 107/71 | HR 82 | Temp 98.4°F

## 2014-08-22 DIAGNOSIS — O34219 Maternal care for unspecified type scar from previous cesarean delivery: Secondary | ICD-10-CM

## 2014-08-22 DIAGNOSIS — O0992 Supervision of high risk pregnancy, unspecified, second trimester: Secondary | ICD-10-CM

## 2014-08-22 DIAGNOSIS — O3421 Maternal care for scar from previous cesarean delivery: Secondary | ICD-10-CM

## 2014-08-22 NOTE — Progress Notes (Signed)
17P injection given  

## 2014-08-23 ENCOUNTER — Other Ambulatory Visit (HOSPITAL_COMMUNITY): Payer: Self-pay | Admitting: Maternal and Fetal Medicine

## 2014-08-23 ENCOUNTER — Encounter (HOSPITAL_COMMUNITY): Payer: Self-pay

## 2014-08-23 ENCOUNTER — Ambulatory Visit (HOSPITAL_COMMUNITY)
Admission: RE | Admit: 2014-08-23 | Discharge: 2014-08-23 | Disposition: A | Discharge status: Home / Self Care | Payer: Medicaid Other | Source: Ambulatory Visit | Attending: Obstetrics & Gynecology | Admitting: Obstetrics & Gynecology

## 2014-08-23 VITALS — BP 102/79 | HR 76 | Wt 148.8 lb

## 2014-08-23 DIAGNOSIS — Z3A27 27 weeks gestation of pregnancy: Secondary | ICD-10-CM | POA: Diagnosis not present

## 2014-08-23 DIAGNOSIS — O99332 Smoking (tobacco) complicating pregnancy, second trimester: Secondary | ICD-10-CM | POA: Diagnosis not present

## 2014-08-23 DIAGNOSIS — O09522 Supervision of elderly multigravida, second trimester: Secondary | ICD-10-CM | POA: Diagnosis not present

## 2014-08-23 DIAGNOSIS — F1120 Opioid dependence, uncomplicated: Secondary | ICD-10-CM | POA: Diagnosis not present

## 2014-08-23 DIAGNOSIS — O99322 Drug use complicating pregnancy, second trimester: Secondary | ICD-10-CM | POA: Diagnosis not present

## 2014-08-23 DIAGNOSIS — O99323 Drug use complicating pregnancy, third trimester: Secondary | ICD-10-CM

## 2014-08-23 DIAGNOSIS — F17200 Nicotine dependence, unspecified, uncomplicated: Secondary | ICD-10-CM

## 2014-08-23 DIAGNOSIS — O09523 Supervision of elderly multigravida, third trimester: Secondary | ICD-10-CM

## 2014-08-26 ENCOUNTER — Encounter (HOSPITAL_COMMUNITY): Payer: Self-pay

## 2014-08-29 ENCOUNTER — Encounter: Payer: Self-pay | Admitting: Family

## 2014-08-29 ENCOUNTER — Ambulatory Visit (INDEPENDENT_AMBULATORY_CARE_PROVIDER_SITE_OTHER): Payer: Medicaid Other | Admitting: *Deleted

## 2014-08-29 ENCOUNTER — Encounter: Payer: Medicaid Other | Admitting: Family

## 2014-08-29 DIAGNOSIS — O3421 Maternal care for scar from previous cesarean delivery: Secondary | ICD-10-CM

## 2014-08-29 DIAGNOSIS — O34219 Maternal care for unspecified type scar from previous cesarean delivery: Secondary | ICD-10-CM

## 2014-08-29 NOTE — Progress Notes (Signed)
17P injection given, Pt refused vital signs/weight.  Pt needs 28 wks labs and states she cannot drink glucola.  Instructed on jelly bean method.  Pt verbalizes understanding.

## 2014-09-05 ENCOUNTER — Ambulatory Visit (INDEPENDENT_AMBULATORY_CARE_PROVIDER_SITE_OTHER): Payer: Medicaid Other | Admitting: *Deleted

## 2014-09-05 VITALS — BP 133/61 | HR 81

## 2014-09-05 DIAGNOSIS — O0992 Supervision of high risk pregnancy, unspecified, second trimester: Secondary | ICD-10-CM

## 2014-09-05 DIAGNOSIS — O99323 Drug use complicating pregnancy, third trimester: Secondary | ICD-10-CM

## 2014-09-05 DIAGNOSIS — Z8751 Personal history of pre-term labor: Secondary | ICD-10-CM

## 2014-09-05 DIAGNOSIS — F1920 Other psychoactive substance dependence, uncomplicated: Secondary | ICD-10-CM

## 2014-09-05 LAB — CBC
HCT: 36.2 % (ref 36.0–46.0)
Hemoglobin: 12.8 g/dL (ref 12.0–15.0)
MCH: 31.7 pg (ref 26.0–34.0)
MCHC: 35.4 g/dL (ref 30.0–36.0)
MCV: 89.6 fL (ref 78.0–100.0)
Platelets: 299 10*3/uL (ref 150–400)
RBC: 4.04 MIL/uL (ref 3.87–5.11)
RDW: 13.3 % (ref 11.5–15.5)
WBC: 10.9 10*3/uL — ABNORMAL HIGH (ref 4.0–10.5)

## 2014-09-06 LAB — HIV ANTIBODY (ROUTINE TESTING W REFLEX): HIV 1&2 Ab, 4th Generation: NONREACTIVE

## 2014-09-06 LAB — RPR

## 2014-09-06 LAB — GLUCOSE TOLERANCE, 1 HOUR (50G) W/O FASTING: Glucose, 1 Hour GTT: 103 mg/dL (ref 70–140)

## 2014-09-11 ENCOUNTER — Encounter: Payer: Self-pay | Admitting: General Practice

## 2014-09-12 ENCOUNTER — Encounter: Payer: Medicaid Other | Admitting: Family Medicine

## 2014-09-12 ENCOUNTER — Ambulatory Visit: Payer: Medicaid Other

## 2014-09-18 ENCOUNTER — Ambulatory Visit (INDEPENDENT_AMBULATORY_CARE_PROVIDER_SITE_OTHER): Payer: Medicaid Other | Admitting: Obstetrics & Gynecology

## 2014-09-18 ENCOUNTER — Encounter: Payer: Self-pay | Admitting: Obstetrics & Gynecology

## 2014-09-18 VITALS — Temp 97.8°F | Wt 155.7 lb

## 2014-09-18 DIAGNOSIS — O09212 Supervision of pregnancy with history of pre-term labor, second trimester: Secondary | ICD-10-CM

## 2014-09-18 DIAGNOSIS — O09892 Supervision of other high risk pregnancies, second trimester: Secondary | ICD-10-CM

## 2014-09-18 NOTE — Progress Notes (Signed)
Requests stronger stool softener/laxative.

## 2014-09-18 NOTE — Progress Notes (Signed)
Nausea and reflux will increase prilosec.

## 2014-09-18 NOTE — Patient Instructions (Signed)
Third Trimester of Pregnancy The third trimester is from week 29 through week 42, months 7 through 9. The third trimester is a time when the fetus is growing rapidly. At the end of the ninth month, the fetus is about 20 inches in length and weighs 6-10 pounds.  BODY CHANGES Your body goes through many changes during pregnancy. The changes vary from woman to woman.   Your weight will continue to increase. You can expect to gain 25-35 pounds (11-16 kg) by the end of the pregnancy.  You may begin to get stretch marks on your hips, abdomen, and breasts.  You may urinate more often because the fetus is moving lower into your pelvis and pressing on your bladder.  You may develop or continue to have heartburn as a result of your pregnancy.  You may develop constipation because certain hormones are causing the muscles that push waste through your intestines to slow down.  You may develop hemorrhoids or swollen, bulging veins (varicose veins).  You may have pelvic pain because of the weight gain and pregnancy hormones relaxing your joints between the bones in your pelvis. Backaches may result from overexertion of the muscles supporting your posture.  You may have changes in your hair. These can include thickening of your hair, rapid growth, and changes in texture. Some women also have hair loss during or after pregnancy, or hair that feels dry or thin. Your hair will most likely return to normal after your baby is born.  Your breasts will continue to grow and be tender. A yellow discharge may leak from your breasts called colostrum.  Your belly button may stick out.  You may feel short of breath because of your expanding uterus.  You may notice the fetus "dropping," or moving lower in your abdomen.  You may have a bloody mucus discharge. This usually occurs a few days to a week before labor begins.  Your cervix becomes thin and soft (effaced) near your due date. WHAT TO EXPECT AT YOUR PRENATAL  EXAMS  You will have prenatal exams every 2 weeks until week 36. Then, you will have weekly prenatal exams. During a routine prenatal visit:  You will be weighed to make sure you and the fetus are growing normally.  Your blood pressure is taken.  Your abdomen will be measured to track your baby's growth.  The fetal heartbeat will be listened to.  Any test results from the previous visit will be discussed.  You may have a cervical check near your due date to see if you have effaced. At around 36 weeks, your caregiver will check your cervix. At the same time, your caregiver will also perform a test on the secretions of the vaginal tissue. This test is to determine if a type of bacteria, Group B streptococcus, is present. Your caregiver will explain this further. Your caregiver may ask you:  What your birth plan is.  How you are feeling.  If you are feeling the baby move.  If you have had any abnormal symptoms, such as leaking fluid, bleeding, severe headaches, or abdominal cramping.  If you have any questions. Other tests or screenings that may be performed during your third trimester include:  Blood tests that check for low iron levels (anemia).  Fetal testing to check the health, activity level, and growth of the fetus. Testing is done if you have certain medical conditions or if there are problems during the pregnancy. FALSE LABOR You may feel small, irregular contractions that   eventually go away. These are called Braxton Hicks contractions, or false labor. Contractions may last for hours, days, or even weeks before true labor sets in. If contractions come at regular intervals, intensify, or become painful, it is best to be seen by your caregiver.  SIGNS OF LABOR   Menstrual-like cramps.  Contractions that are 5 minutes apart or less.  Contractions that start on the top of the uterus and spread down to the lower abdomen and back.  A sense of increased pelvic pressure or back  pain.  A watery or bloody mucus discharge that comes from the vagina. If you have any of these signs before the 37th week of pregnancy, call your caregiver right away. You need to go to the hospital to get checked immediately. HOME CARE INSTRUCTIONS   Avoid all smoking, herbs, alcohol, and unprescribed drugs. These chemicals affect the formation and growth of the baby.  Follow your caregiver's instructions regarding medicine use. There are medicines that are either safe or unsafe to take during pregnancy.  Exercise only as directed by your caregiver. Experiencing uterine cramps is a good sign to stop exercising.  Continue to eat regular, healthy meals.  Wear a good support bra for breast tenderness.  Do not use hot tubs, steam rooms, or saunas.  Wear your seat belt at all times when driving.  Avoid raw meat, uncooked cheese, cat litter boxes, and soil used by cats. These carry germs that can cause birth defects in the baby.  Take your prenatal vitamins.  Try taking a stool softener (if your caregiver approves) if you develop constipation. Eat more high-fiber foods, such as fresh vegetables or fruit and whole grains. Drink plenty of fluids to keep your urine clear or pale yellow.  Take warm sitz baths to soothe any pain or discomfort caused by hemorrhoids. Use hemorrhoid cream if your caregiver approves.  If you develop varicose veins, wear support hose. Elevate your feet for 15 minutes, 3-4 times a day. Limit salt in your diet.  Avoid heavy lifting, wear low heal shoes, and practice good posture.  Rest a lot with your legs elevated if you have leg cramps or low back pain.  Visit your dentist if you have not gone during your pregnancy. Use a soft toothbrush to brush your teeth and be gentle when you floss.  A sexual relationship may be continued unless your caregiver directs you otherwise.  Do not travel far distances unless it is absolutely necessary and only with the approval  of your caregiver.  Take prenatal classes to understand, practice, and ask questions about the labor and delivery.  Make a trial run to the hospital.  Pack your hospital bag.  Prepare the baby's nursery.  Continue to go to all your prenatal visits as directed by your caregiver. SEEK MEDICAL CARE IF:  You are unsure if you are in labor or if your water has broken.  You have dizziness.  You have mild pelvic cramps, pelvic pressure, or nagging pain in your abdominal area.  You have persistent nausea, vomiting, or diarrhea.  You have a bad smelling vaginal discharge.  You have pain with urination. SEEK IMMEDIATE MEDICAL CARE IF:   You have a fever.  You are leaking fluid from your vagina.  You have spotting or bleeding from your vagina.  You have severe abdominal cramping or pain.  You have rapid weight loss or gain.  You have shortness of breath with chest pain.  You notice sudden or extreme swelling   of your face, hands, ankles, feet, or legs.  You have not felt your baby move in over an hour.  You have severe headaches that do not go away with medicine.  You have vision changes. Document Released: 10/05/2001 Document Revised: 10/16/2013 Document Reviewed: 12/12/2012 ExitCare Patient Information 2015 [employer]. This information is not intended to replace advice given to you by your health care provider. Make sure you discuss any questions you have with your health care provider.  

## 2014-09-18 NOTE — Progress Notes (Signed)
Makena reodered

## 2014-09-20 ENCOUNTER — Ambulatory Visit (HOSPITAL_COMMUNITY)
Admission: RE | Admit: 2014-09-20 | Discharge: 2014-09-20 | Disposition: A | Discharge status: Home / Self Care | Payer: Medicaid Other | Source: Ambulatory Visit | Attending: Obstetrics & Gynecology | Admitting: Obstetrics & Gynecology

## 2014-09-20 ENCOUNTER — Other Ambulatory Visit (HOSPITAL_COMMUNITY): Payer: Self-pay | Admitting: Maternal and Fetal Medicine

## 2014-09-20 DIAGNOSIS — F1120 Opioid dependence, uncomplicated: Secondary | ICD-10-CM

## 2014-09-20 DIAGNOSIS — F17200 Nicotine dependence, unspecified, uncomplicated: Secondary | ICD-10-CM

## 2014-09-20 DIAGNOSIS — Z3A00 Weeks of gestation of pregnancy not specified: Secondary | ICD-10-CM | POA: Insufficient documentation

## 2014-09-20 DIAGNOSIS — O99333 Smoking (tobacco) complicating pregnancy, third trimester: Secondary | ICD-10-CM | POA: Insufficient documentation

## 2014-09-20 DIAGNOSIS — O09523 Supervision of elderly multigravida, third trimester: Secondary | ICD-10-CM | POA: Diagnosis not present

## 2014-09-20 DIAGNOSIS — F1121 Opioid dependence, in remission: Secondary | ICD-10-CM | POA: Diagnosis not present

## 2014-09-20 DIAGNOSIS — O99323 Drug use complicating pregnancy, third trimester: Secondary | ICD-10-CM

## 2014-09-26 ENCOUNTER — Ambulatory Visit (INDEPENDENT_AMBULATORY_CARE_PROVIDER_SITE_OTHER): Payer: Medicaid Other | Admitting: General Practice

## 2014-09-26 VITALS — BP 124/82 | HR 79 | Temp 97.5°F | Ht 69.0 in | Wt 158.0 lb

## 2014-09-26 DIAGNOSIS — O09213 Supervision of pregnancy with history of pre-term labor, third trimester: Secondary | ICD-10-CM

## 2014-09-26 DIAGNOSIS — O09893 Supervision of other high risk pregnancies, third trimester: Secondary | ICD-10-CM

## 2014-09-26 NOTE — Progress Notes (Signed)
Patient did not wish to stay to see Arline AspCindy today

## 2014-10-03 ENCOUNTER — Ambulatory Visit (INDEPENDENT_AMBULATORY_CARE_PROVIDER_SITE_OTHER): Payer: Medicaid Other | Admitting: Family Medicine

## 2014-10-03 ENCOUNTER — Encounter: Payer: Self-pay | Admitting: Family Medicine

## 2014-10-03 VITALS — BP 127/56 | HR 71 | Temp 98.1°F | Wt 160.3 lb

## 2014-10-03 DIAGNOSIS — O09529 Supervision of elderly multigravida, unspecified trimester: Secondary | ICD-10-CM

## 2014-10-03 DIAGNOSIS — O09211 Supervision of pregnancy with history of pre-term labor, first trimester: Secondary | ICD-10-CM

## 2014-10-03 DIAGNOSIS — R12 Heartburn: Secondary | ICD-10-CM

## 2014-10-03 DIAGNOSIS — O3421 Maternal care for scar from previous cesarean delivery: Secondary | ICD-10-CM

## 2014-10-03 DIAGNOSIS — O26892 Other specified pregnancy related conditions, second trimester: Secondary | ICD-10-CM

## 2014-10-03 DIAGNOSIS — O09213 Supervision of pregnancy with history of pre-term labor, third trimester: Secondary | ICD-10-CM

## 2014-10-03 DIAGNOSIS — O09893 Supervision of other high risk pregnancies, third trimester: Secondary | ICD-10-CM

## 2014-10-03 DIAGNOSIS — F1120 Opioid dependence, uncomplicated: Secondary | ICD-10-CM

## 2014-10-03 DIAGNOSIS — O99320 Drug use complicating pregnancy, unspecified trimester: Secondary | ICD-10-CM

## 2014-10-03 MED ORDER — DOCUSATE SODIUM 100 MG PO CAPS: 100.0000 mg | ORAL_CAPSULE | Freq: Two times a day (BID) | ORAL | Status: DC

## 2014-10-03 NOTE — Progress Notes (Signed)
Colace refilled--may use Mag Citrate prn constipation Continue 17 P Normal 1 hour U/s showed baby at 22%, AFI was 9.--f/u in 3 wks.--no change in FH x 2 wks--watch growth closely SW today

## 2014-10-03 NOTE — Progress Notes (Signed)
Requests refill Colace.  C/o of intermittent mild pelvic pressure.  17p today.

## 2014-10-03 NOTE — Patient Instructions (Addendum)
Mag Citrate can be used for causing a BM Third Trimester of Pregnancy The third trimester is from week 29 through week 42, months 7 through 9. The third trimester is a time when the fetus is growing rapidly. At the end of the ninth month, the fetus is about 20 inches in length and weighs 6-10 pounds.  BODY CHANGES Your body goes through many changes during pregnancy. The changes vary from woman to woman.   Your weight will continue to increase. You can expect to gain 25-35 pounds (11-16 kg) by the end of the pregnancy.  You may begin to get stretch marks on your hips, abdomen, and breasts.  You may urinate more often because the fetus is moving lower into your pelvis and pressing on your bladder.  You may develop or continue to have heartburn as a result of your pregnancy.  You may develop constipation because certain hormones are causing the muscles that push waste through your intestines to slow down.  You may develop hemorrhoids or swollen, bulging veins (varicose veins).  You may have pelvic pain because of the weight gain and pregnancy hormones relaxing your joints between the bones in your pelvis. Backaches may result from overexertion of the muscles supporting your posture.  You may have changes in your hair. These can include thickening of your hair, rapid growth, and changes in texture. Some women also have hair loss during or after pregnancy, or hair that feels dry or thin. Your hair will most likely return to normal after your baby is born.  Your breasts will continue to grow and be tender. A yellow discharge may leak from your breasts called colostrum.  Your belly button may stick out.  You may feel short of breath because of your expanding uterus.  You may notice the fetus "dropping," or moving lower in your abdomen.  You may have a bloody mucus discharge. This usually occurs a few days to a week before labor begins.  Your cervix becomes thin and soft (effaced) near your  due date. WHAT TO EXPECT AT YOUR PRENATAL EXAMS  You will have prenatal exams every 2 weeks until week 36. Then, you will have weekly prenatal exams. During a routine prenatal visit:  You will be weighed to make sure you and the fetus are growing normally.  Your blood pressure is taken.  Your abdomen will be measured to track your baby's growth.  The fetal heartbeat will be listened to.  Any test results from the previous visit will be discussed.  You may have a cervical check near your due date to see if you have effaced. At around 36 weeks, your caregiver will check your cervix. At the same time, your caregiver will also perform a test on the secretions of the vaginal tissue. This test is to determine if a type of bacteria, Group B streptococcus, is present. Your caregiver will explain this further. Your caregiver may ask you:  What your birth plan is.  How you are feeling.  If you are feeling the baby move.  If you have had any abnormal symptoms, such as leaking fluid, bleeding, severe headaches, or abdominal cramping.  If you have any questions. Other tests or screenings that may be performed during your third trimester include:  Blood tests that check for low iron levels (anemia).  Fetal testing to check the health, activity level, and growth of the fetus. Testing is done if you have certain medical conditions or if there are problems during the pregnancy.  FALSE LABOR You may feel small, irregular contractions that eventually go away. These are called Braxton Hicks contractions, or false labor. Contractions may last for hours, days, or even weeks before true labor sets in. If contractions come at regular intervals, intensify, or become painful, it is best to be seen by your caregiver.  SIGNS OF LABOR   Menstrual-like cramps.  Contractions that are 5 minutes apart or less.  Contractions that start on the top of the uterus and spread down to the lower abdomen and back.  A  sense of increased pelvic pressure or back pain.  A watery or bloody mucus discharge that comes from the vagina. If you have any of these signs before the 37th week of pregnancy, call your caregiver right away. You need to go to the hospital to get checked immediately. HOME CARE INSTRUCTIONS   Avoid all smoking, herbs, alcohol, and unprescribed drugs. These chemicals affect the formation and growth of the baby.  Follow your caregiver's instructions regarding medicine use. There are medicines that are either safe or unsafe to take during pregnancy.  Exercise only as directed by your caregiver. Experiencing uterine cramps is a good sign to stop exercising.  Continue to eat regular, healthy meals.  Wear a good support bra for breast tenderness.  Do not use hot tubs, steam rooms, or saunas.  Wear your seat belt at all times when driving.  Avoid raw meat, uncooked cheese, cat litter boxes, and soil used by cats. These carry germs that can cause birth defects in the baby.  Take your prenatal vitamins.  Try taking a stool softener (if your caregiver approves) if you develop constipation. Eat more high-fiber foods, such as fresh vegetables or fruit and whole grains. Drink plenty of fluids to keep your urine clear or pale yellow.  Take warm sitz baths to soothe any pain or discomfort caused by hemorrhoids. Use hemorrhoid cream if your caregiver approves.  If you develop varicose veins, wear support hose. Elevate your feet for 15 minutes, 3-4 times a day. Limit salt in your diet.  Avoid heavy lifting, wear low heal shoes, and practice good posture.  Rest a lot with your legs elevated if you have leg cramps or low back pain.  Visit your dentist if you have not gone during your pregnancy. Use a soft toothbrush to brush your teeth and be gentle when you floss.  A sexual relationship may be continued unless your caregiver directs you otherwise.  Do not travel far distances unless it is  absolutely necessary and only with the approval of your caregiver.  Take prenatal classes to understand, practice, and ask questions about the labor and delivery.  Make a trial run to the hospital.  Pack your hospital bag.  Prepare the baby's nursery.  Continue to go to all your prenatal visits as directed by your caregiver. SEEK MEDICAL CARE IF:  You are unsure if you are in labor or if your water has broken.  You have dizziness.  You have mild pelvic cramps, pelvic pressure, or nagging pain in your abdominal area.  You have persistent nausea, vomiting, or diarrhea.  You have a bad smelling vaginal discharge.  You have pain with urination. SEEK IMMEDIATE MEDICAL CARE IF:   You have a fever.  You are leaking fluid from your vagina.  You have spotting or bleeding from your vagina.  You have severe abdominal cramping or pain.  You have rapid weight loss or gain.  You have shortness of breath with  chest pain.  You notice sudden or extreme swelling of your face, hands, ankles, feet, or legs.  You have not felt your baby move in over an hour.  You have severe headaches that do not go away with medicine.  You have vision changes. Document Released: 10/05/2001 Document Revised: 10/16/2013 Document Reviewed: 12/12/2012 [facility] Patient Information 2015 [place], [place]. This information is not intended to replace advice given to you by your health care provider. Make sure you discuss any questions you have with your health care provider.  Breastfeeding Deciding to breastfeed is one of the best choices you can make for you and your baby. A change in hormones during pregnancy causes your breast tissue to grow and increases the number and size of your milk ducts. These hormones also allow proteins, sugars, and fats from your blood supply to make breast milk in your milk-producing glands. Hormones prevent breast milk from being released before your baby is born as well as prompt  milk flow after birth. Once breastfeeding has begun, thoughts of your baby, as well as his or her sucking or crying, can stimulate the release of milk from your milk-producing glands.  BENEFITS OF BREASTFEEDING For Your Baby  Your first milk (colostrum) helps your baby's digestive system function better.   There are antibodies in your milk that help your baby fight off infections.   Your baby has a lower incidence of asthma, allergies, and sudden infant death syndrome.   The nutrients in breast milk are better for your baby than infant formulas and are designed uniquely for your baby's needs.   Breast milk improves your baby's brain development.   Your baby is less likely to develop other conditions, such as childhood obesity, asthma, or type 2 diabetes mellitus.  For You   Breastfeeding helps to create a very special bond between you and your baby.   Breastfeeding is convenient. Breast milk is always available at the correct temperature and costs nothing.   Breastfeeding helps to burn calories and helps you lose the weight gained during pregnancy.   Breastfeeding makes your uterus contract to its prepregnancy size faster and slows bleeding (lochia) after you give birth.   Breastfeeding helps to lower your risk of developing type 2 diabetes mellitus, osteoporosis, and breast or ovarian cancer later in life. SIGNS THAT YOUR BABY IS HUNGRY Early Signs of Hunger  Increased alertness or activity.  Stretching.  Movement of the head from side to side.  Movement of the head and opening of the mouth when the corner of the mouth or cheek is stroked (rooting).  Increased sucking sounds, smacking lips, cooing, sighing, or squeaking.  Hand-to-mouth movements.  Increased sucking of fingers or hands. Late Signs of Hunger  Fussing.  Intermittent crying. Extreme Signs of Hunger Signs of extreme hunger will require calming and consoling before your baby will be able to  breastfeed successfully. Do not wait for the following signs of extreme hunger to occur before you initiate breastfeeding:   Restlessness.  A loud, strong cry.   Screaming. BREASTFEEDING BASICS Breastfeeding Initiation  Find a comfortable place to sit or lie down, with your neck and back well supported.  Place a pillow or rolled up blanket under your baby to bring him or her to the level of your breast (if you are seated). Nursing pillows are specially designed to help support your arms and your baby while you breastfeed.  Make sure that your baby's abdomen is facing your abdomen.   Gently massage  your breast. With your fingertips, massage from your chest wall toward your nipple in a circular motion. This encourages milk flow. You may need to continue this action during the feeding if your milk flows slowly.  Support your breast with 4 fingers underneath and your thumb above your nipple. Make sure your fingers are well away from your nipple and your baby's mouth.   Stroke your baby's lips gently with your finger or nipple.   When your baby's mouth is open wide enough, quickly bring your baby to your breast, placing your entire nipple and as much of the colored area around your nipple (areola) as possible into your baby's mouth.   More areola should be visible above your baby's upper lip than below the lower lip.   Your baby's tongue should be between his or her lower gum and your breast.   Ensure that your baby's mouth is correctly positioned around your nipple (latched). Your baby's lips should create a seal on your breast and be turned out (everted).  It is common for your baby to suck about 2-3 minutes in order to start the flow of breast milk. Latching Teaching your baby how to latch on to your breast properly is very important. An improper latch can cause nipple pain and decreased milk supply for you and poor weight gain in your baby. Also, if your baby is not latched onto  your nipple properly, he or she may swallow some air during feeding. This can make your baby fussy. Burping your baby when you switch breasts during the feeding can help to get rid of the air. However, teaching your baby to latch on properly is still the best way to prevent fussiness from swallowing air while breastfeeding. Signs that your baby has successfully latched on to your nipple:    Silent tugging or silent sucking, without causing you pain.   Swallowing heard between every 3-4 sucks.    Muscle movement above and in front of his or her ears while sucking.  Signs that your baby has not successfully latched on to nipple:   Sucking sounds or smacking sounds from your baby while breastfeeding.  Nipple pain. If you think your baby has not latched on correctly, slip your finger into the corner of your baby's mouth to break the suction and place it between your baby's gums. Attempt breastfeeding initiation again. Signs of Successful Breastfeeding Signs from your baby:   A gradual decrease in the number of sucks or complete cessation of sucking.   Falling asleep.   Relaxation of his or her body.   Retention of a small amount of milk in his or her mouth.   Letting go of your breast by himself or herself. Signs from you:  Breasts that have increased in firmness, weight, and size 1-3 hours after feeding.   Breasts that are softer immediately after breastfeeding.  Increased milk volume, as well as a change in milk consistency and color by the fifth day of breastfeeding.   Nipples that are not sore, cracked, or bleeding. Signs That Your Pecola Leisure is Getting Enough Milk  Wetting at least 3 diapers in a 24-hour period. The urine should be clear and pale yellow by age 386 days.  At least 3 stools in a 24-hour period by age 386 days. The stool should be soft and yellow.  At least 3 stools in a 24-hour period by age 62 days. The stool should be seedy and yellow.  No loss of weight  greater  than 10% of birth weight during the first 57 days of age.  Average weight gain of 4-7 ounces (113-198 g) per week after age 548 days.  Consistent daily weight gain by age 54 days, without weight loss after the age of 2 weeks. After a feeding, your baby may spit up a small amount. This is common. BREASTFEEDING FREQUENCY AND DURATION Frequent feeding will help you make more milk and can prevent sore nipples and breast engorgement. Breastfeed when you feel the need to reduce the fullness of your breasts or when your baby shows signs of hunger. This is called "breastfeeding on demand." Avoid introducing a pacifier to your baby while you are working to establish breastfeeding (the first 4-6 weeks after your baby is born). After this time you may choose to use a pacifier. Research has shown that pacifier use during the first year of a baby's life decreases the risk of sudden infant death syndrome (SIDS). Allow your baby to feed on each breast as long as he or she wants. Breastfeed until your baby is finished feeding. When your baby unlatches or falls asleep while feeding from the first breast, offer the second breast. Because newborns are often sleepy in the first few weeks of life, you may need to awaken your baby to get him or her to feed. Breastfeeding times will vary from baby to baby. However, the following rules can serve as a guide to help you ensure that your baby is properly fed:  Newborns (babies 41 weeks of age or younger) may breastfeed every 1-3 hours.  Newborns should not go longer than 3 hours during the day or 5 hours during the night without breastfeeding.  You should breastfeed your baby a minimum of 8 times in a 24-hour period until you begin to introduce solid foods to your baby at around 45 months of age. BREAST MILK PUMPING Pumping and storing breast milk allows you to ensure that your baby is exclusively fed your breast milk, even at times when you are unable to breastfeed. This  is especially important if you are going back to work while you are still breastfeeding or when you are not able to be present during feedings. Your lactation consultant can give you guidelines on how long it is safe to store breast milk.  A breast pump is a machine that allows you to pump milk from your breast into a sterile bottle. The pumped breast milk can then be stored in a refrigerator or freezer. Some breast pumps are operated by hand, while others use electricity. Ask your lactation consultant which type will work best for you. Breast pumps can be purchased, but some hospitals and breastfeeding support groups lease breast pumps on a monthly basis. A lactation consultant can teach you how to hand express breast milk, if you prefer not to use a pump.  CARING FOR YOUR BREASTS WHILE YOU BREASTFEED Nipples can become dry, cracked, and sore while breastfeeding. The following recommendations can help keep your breasts moisturized and healthy:  Avoid using soap on your nipples.   Wear a supportive bra. Although not required, special nursing bras and tank tops are designed to allow access to your breasts for breastfeeding without taking off your entire bra or top. Avoid wearing underwire-style bras or extremely tight bras.  Air dry your nipples for 3-26minutes after each feeding.   Use only cotton bra pads to absorb leaked breast milk. Leaking of breast milk between feedings is normal.   Use lanolin on your  nipples after breastfeeding. Lanolin helps to maintain your skin's normal moisture barrier. If you use pure lanolin, you do not need to wash it off before feeding your baby again. Pure lanolin is not toxic to your baby. You may also hand express a few drops of breast milk and gently massage that milk into your nipples and allow the milk to air dry. In the first few weeks after giving birth, some women experience extremely full breasts (engorgement). Engorgement can make your breasts feel heavy,  warm, and tender to the touch. Engorgement peaks within 3-5 days after you give birth. The following recommendations can help ease engorgement:  Completely empty your breasts while breastfeeding or pumping. You may want to start by applying warm, moist heat (in the shower or with warm water-soaked hand towels) just before feeding or pumping. This increases circulation and helps the milk flow. If your baby does not completely empty your breasts while breastfeeding, pump any extra milk after he or she is finished.  Wear a snug bra (nursing or regular) or tank top for 1-2 days to signal your body to slightly decrease milk production.  Apply ice packs to your breasts, unless this is too uncomfortable for you.  Make sure that your baby is latched on and positioned properly while breastfeeding. If engorgement persists after 48 hours of following these recommendations, contact your health care provider or a Advertising copywriter. OVERALL HEALTH CARE RECOMMENDATIONS WHILE BREASTFEEDING  Eat healthy foods. Alternate between meals and snacks, eating 3 of each per day. Because what you eat affects your breast milk, some of the foods may make your baby more irritable than usual. Avoid eating these foods if you are sure that they are negatively affecting your baby.  Drink milk, fruit juice, and water to satisfy your thirst (about 10 glasses a day).   Rest often, relax, and continue to take your prenatal vitamins to prevent fatigue, stress, and anemia.  Continue breast self-awareness checks.  Avoid chewing and smoking tobacco.  Avoid alcohol and drug use. Some medicines that may be harmful to your baby can pass through breast milk. It is important to ask your health care provider before taking any medicine, including all over-the-counter and prescription medicine as well as vitamin and herbal supplements. It is possible to become pregnant while breastfeeding. If birth control is desired, ask your health  care provider about options that will be safe for your baby. SEEK MEDICAL CARE IF:   You feel like you want to stop breastfeeding or have become frustrated with breastfeeding.  You have painful breasts or nipples.  Your nipples are cracked or bleeding.  Your breasts are red, tender, or warm.  You have a swollen area on either breast.  You have a fever or chills.  You have nausea or vomiting.  You have drainage other than breast milk from your nipples.  Your breasts do not become full before feedings by the fifth day after you give birth.  You feel sad and depressed.  Your baby is too sleepy to eat well.  Your baby is having trouble sleeping.   Your baby is wetting less than 3 diapers in a 24-hour period.  Your baby has less than 3 stools in a 24-hour period.  Your baby's skin or the white part of his or her eyes becomes yellow.   Your baby is not gaining weight by 13 days of age. SEEK IMMEDIATE MEDICAL CARE IF:   Your baby is overly tired (lethargic) and does not  want to wake up and feed.  Your baby develops an unexplained fever. Document Released: 10/11/2005 Document Revised: 10/16/2013 Document Reviewed: 04/04/2013 [facility] Patient Information 2015 [place], [place]. This information is not intended to replace advice given to you by your health care provider. Make sure you discuss any questions you have with your health care provider.

## 2014-10-10 ENCOUNTER — Ambulatory Visit (INDEPENDENT_AMBULATORY_CARE_PROVIDER_SITE_OTHER): Payer: Medicaid Other

## 2014-10-10 VITALS — BP 125/55 | HR 78 | Wt 162.5 lb

## 2014-10-10 DIAGNOSIS — O0992 Supervision of high risk pregnancy, unspecified, second trimester: Secondary | ICD-10-CM

## 2014-10-10 NOTE — Progress Notes (Signed)
Patient here today for weekly injection of 17P. 17P administered into RUO quadrant of buttocks. Patient tolerated well. No questions or concerns to report. To return 10/14/14 for next injection.

## 2014-10-11 ENCOUNTER — Ambulatory Visit (HOSPITAL_COMMUNITY): Payer: Medicaid Other

## 2014-10-11 ENCOUNTER — Encounter: Payer: Self-pay | Admitting: *Deleted

## 2014-10-14 ENCOUNTER — Ambulatory Visit (INDEPENDENT_AMBULATORY_CARE_PROVIDER_SITE_OTHER): Payer: Medicaid Other | Admitting: Obstetrics and Gynecology

## 2014-10-14 VITALS — BP 128/71 | HR 80 | Wt 160.3 lb

## 2014-10-14 DIAGNOSIS — O0993 Supervision of high risk pregnancy, unspecified, third trimester: Secondary | ICD-10-CM

## 2014-10-14 DIAGNOSIS — O09212 Supervision of pregnancy with history of pre-term labor, second trimester: Secondary | ICD-10-CM

## 2014-10-14 DIAGNOSIS — R12 Heartburn: Secondary | ICD-10-CM

## 2014-10-14 DIAGNOSIS — O09523 Supervision of elderly multigravida, third trimester: Secondary | ICD-10-CM

## 2014-10-14 DIAGNOSIS — O99323 Drug use complicating pregnancy, third trimester: Secondary | ICD-10-CM

## 2014-10-14 DIAGNOSIS — O26892 Other specified pregnancy related conditions, second trimester: Secondary | ICD-10-CM

## 2014-10-14 DIAGNOSIS — O34219 Maternal care for unspecified type scar from previous cesarean delivery: Secondary | ICD-10-CM

## 2014-10-14 DIAGNOSIS — F1921 Other psychoactive substance dependence, in remission: Secondary | ICD-10-CM

## 2014-10-14 DIAGNOSIS — F1920 Other psychoactive substance dependence, uncomplicated: Secondary | ICD-10-CM

## 2014-10-14 DIAGNOSIS — O3421 Maternal care for scar from previous cesarean delivery: Secondary | ICD-10-CM

## 2014-10-14 MED ORDER — ONDANSETRON 4 MG PO TBDP: 4.0000 mg | ORAL_TABLET | Freq: Four times a day (QID) | ORAL | Status: DC | PRN

## 2014-10-14 MED ORDER — OMEPRAZOLE 40 MG PO CPDR: 40.0000 mg | DELAYED_RELEASE_CAPSULE | Freq: Two times a day (BID) | ORAL | Status: DC

## 2014-10-14 MED ORDER — PRENATAL PLUS 27-1 MG PO TABS: 1.0000 | ORAL_TABLET | Freq: Every day | ORAL | Status: DC

## 2014-10-14 NOTE — Progress Notes (Signed)
38 y.o. [id number] at [id number]. Doing well today.  1. Size less than dates. Measured 31 cm for the last three visits. Has growth ultrasound scheduled tomorrow.  2. History of preterm delivery. Continue 17P.  3. Routine PNC. Scheduled c-section on 12/30. GC/CT, GBS swabs done today.

## 2014-10-14 NOTE — Progress Notes (Signed)
Pt needs refill on zofran,  colace and omperazole.

## 2014-10-14 NOTE — Addendum Note (Signed)
Addended by: Candelaria StagersHAIZLIP, Graviel Payeur E on: 10/14/2014 03:10 PM   Modules accepted: Orders

## 2014-10-15 ENCOUNTER — Ambulatory Visit (HOSPITAL_COMMUNITY)
Admission: RE | Admit: 2014-10-15 | Discharge: 2014-10-15 | Disposition: A | Discharge status: Home / Self Care | Payer: Medicaid Other | Source: Ambulatory Visit | Attending: Obstetrics & Gynecology | Admitting: Obstetrics & Gynecology

## 2014-10-15 ENCOUNTER — Other Ambulatory Visit (HOSPITAL_COMMUNITY): Payer: Self-pay | Admitting: Maternal and Fetal Medicine

## 2014-10-15 ENCOUNTER — Encounter (HOSPITAL_COMMUNITY): Payer: Self-pay

## 2014-10-15 ENCOUNTER — Inpatient Hospital Stay (HOSPITAL_COMMUNITY)
Admission: AD | Admit: 2014-10-15 | Discharge: 2014-10-16 | Discharge status: Against Medical Advice | Payer: Medicaid Other | Source: Ambulatory Visit | Attending: Family Medicine | Admitting: Family Medicine

## 2014-10-15 ENCOUNTER — Encounter (HOSPITAL_COMMUNITY): Payer: Self-pay | Admitting: *Deleted

## 2014-10-15 DIAGNOSIS — O365990 Maternal care for other known or suspected poor fetal growth, unspecified trimester, not applicable or unspecified: Secondary | ICD-10-CM | POA: Insufficient documentation

## 2014-10-15 DIAGNOSIS — Z720 Tobacco use: Secondary | ICD-10-CM | POA: Insufficient documentation

## 2014-10-15 DIAGNOSIS — O09523 Supervision of elderly multigravida, third trimester: Secondary | ICD-10-CM | POA: Insufficient documentation

## 2014-10-15 DIAGNOSIS — F17200 Nicotine dependence, unspecified, uncomplicated: Secondary | ICD-10-CM

## 2014-10-15 DIAGNOSIS — F1121 Opioid dependence, in remission: Secondary | ICD-10-CM

## 2014-10-15 DIAGNOSIS — Z3A35 35 weeks gestation of pregnancy: Secondary | ICD-10-CM | POA: Insufficient documentation

## 2014-10-15 DIAGNOSIS — O99323 Drug use complicating pregnancy, third trimester: Secondary | ICD-10-CM | POA: Insufficient documentation

## 2014-10-15 DIAGNOSIS — F1120 Opioid dependence, uncomplicated: Secondary | ICD-10-CM

## 2014-10-15 DIAGNOSIS — O3421 Maternal care for scar from previous cesarean delivery: Secondary | ICD-10-CM | POA: Insufficient documentation

## 2014-10-15 DIAGNOSIS — O99333 Smoking (tobacco) complicating pregnancy, third trimester: Secondary | ICD-10-CM | POA: Insufficient documentation

## 2014-10-15 DIAGNOSIS — O4693 Antepartum hemorrhage, unspecified, third trimester: Secondary | ICD-10-CM

## 2014-10-15 LAB — GC/CHLAMYDIA PROBE AMP
CT Probe RNA: NEGATIVE
GC PROBE AMP APTIMA: NEGATIVE

## 2014-10-15 NOTE — MAU Provider Note (Signed)
History     CSN: [id number]  Arrival date and time: 10/15/14 2028   None     Chief Complaint  Patient presents with  . Labor Eval   HPI Paige Bailey is a 38yo [id number] @ 35.1wks who presents for eval of vag bldg that began suddenly this evening @ 6pm while shopping. She reports that it went through her underwear and into her pants. Denies s/s of ROM; reports having ctx that have been the same as they have been all week. Had cx exam on 12/21 of 1.5cm and BPP 8/8 today at [facility]. Denies illicit drug use; no recent IC. 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 Adult ADHD   . COPD (chronic obstructive pulmonary disease)     Past Surgical History  Procedure Laterality Date  . Cesarean section    . Appendectomy      Family History  Problem Relation Age of Onset  . Hyperlipidemia Father   . Heart disease Father   . Hypertension Father   . Stroke Father   . Heart disease Brother   . Hyperlipidemia Brother   . Hypertension Brother     History  Substance Use Topics  . Smoking status: Current Every Day Smoker -- 0.50 packs/day  . Smokeless tobacco: Never Used  . Alcohol Use: No    Allergies:  Allergies  Allergen Reactions  . Phenergan [Promethazine Hcl]     Legs shaking     Facility-administered medications prior to admission  Medication Dose Route Frequency Provider Last Rate Last Dose  . hydroxyprogesterone caproate (DELALUTIN) 250 mg/mL injection 250 mg  250 mg Intramuscular Weekly Rhona RaiderJacob J Stinson, DO   250 mg at 10/10/14 1025   Prescriptions  prior to admission  Medication Sig Dispense Refill Last Dose  . albuterol (PROVENTIL HFA;VENTOLIN HFA) 108 (90 BASE) MCG/ACT inhaler Inhale 2 puffs into the lungs every 6 (six) hours as needed for wheezing or shortness of breath.    Taking  . amphetamine-dextroamphetamine (ADDERALL) 20 MG tablet Take 20 mg by mouth 3 (three) times daily.   Taking  . docusate sodium (COLACE) 100 MG capsule Take 1 capsule (100 mg total) by mouth 2 (two) times daily. 60 capsule 5 Taking  . hydroxyprogesterone caproate (DELALUTIN) 250 mg/mL OIL injection Inject 250 mg into the muscle once.   Taking  . methadone (DOLOPHINE) 10 MG/5ML solution Take 160 mg by mouth every 6 (six) hours as needed for pain.   Taking  . omeprazole (PRILOSEC) 40 MG capsule Take 1 capsule (40 mg total) by mouth 2 (two) times daily. 180 capsule 5 Taking  . ondansetron (ZOFRAN ODT) 4 MG disintegrating tablet Take 1 tablet (4 mg total) by mouth every 6 (six) hours as needed for nausea. 20 tablet 5 Taking  . prenatal vitamin w/FE, FA (PRENATAL 1 + 1) 27-1 MG TABS tablet Take 1 tablet by mouth daily at 12 noon. 30 each 2  Taking    ROS Physical Exam   There were no vitals taken for this visit.  Physical Exam  Constitutional: She is oriented to person, place, and time. She appears well-developed.  HENT:  Head: Normocephalic.  Neck: Normal range of motion.  Cardiovascular: Normal rate.   Respiratory: Effort normal.  GI:  EFM 135-145, +accels, no decels Ctx irreg 3-6 mins  Genitourinary:  SE: sm blood at cx, dried bld on perineum, cx post loose 1/70/-2  Musculoskeletal: Normal range of motion.  Neurological: She is alert and oriented to person, place, and time.  Skin: Skin is warm and dry.  Psychiatric: She has a normal mood and affect. Her behavior is normal. Thought content normal.    MAU Course  Procedures    Assessment and Plan  IUP@35 .1 Third tri bleeding  Dr Adrian BlackwaterStinson in to discuss recommended plan with pt which includes  inpt admission for 5-7days to watch for additional bleeding. R&Bs of declining care rev'd with pt, up to and including fetal/maternal death if this were to happen again to a greater extent at home. Pt and spouse have discussed and decided to leave AMA and state they understand fully the risks. Have inboxed the clinic to get a NST tomorrow.  Cam HaiSHAW, Paige Bailey CNM 10/15/2014, 11:32 PM

## 2014-10-15 NOTE — MAU Note (Signed)
Pincus BadderK. Shaw CNM called and informed that this pt continues to wait for evaluation

## 2014-10-15 NOTE — MAU Note (Signed)
Pt states she was seen at her regular MD' s appointment today and was told that she examined. Pt states she was told that she 1.5 cm dilated. Pt states she while [facility] was at [employer] she felt something

## 2014-10-15 NOTE — MAU Note (Signed)
PT  NOT IN LOBBY- HUSBAND GONE TO PARKING LOT  TO GET HER.

## 2014-10-15 NOTE — Progress Notes (Signed)
Patient states her pain in 10 when she has a contractions

## 2014-10-15 NOTE — MAU Note (Addendum)
PT  SAYS SHE STARTED BLEEDING  AT   AT 7 PM-  NO  BLEEDING IN PAST.       SHE  IS REPEAT  C/[facility]-  SCH 12-30.      VE  YESTERDAY-  THIN.   ALSO  [facility]-    X1 WEEK.   IN TRIAGE -  PAD ON  WITH RED BLOOD -  MOD

## 2014-10-16 ENCOUNTER — Encounter: Payer: Medicaid Other | Admitting: Physician Assistant

## 2014-10-16 ENCOUNTER — Ambulatory Visit (INDEPENDENT_AMBULATORY_CARE_PROVIDER_SITE_OTHER): Payer: Medicaid Other | Admitting: *Deleted

## 2014-10-16 ENCOUNTER — Encounter (HOSPITAL_COMMUNITY): Payer: Self-pay | Admitting: Obstetrics and Gynecology

## 2014-10-16 ENCOUNTER — Encounter: Payer: Self-pay | Admitting: Family Medicine

## 2014-10-16 ENCOUNTER — Inpatient Hospital Stay (HOSPITAL_COMMUNITY)
Admission: AD | Admit: 2014-10-16 | Discharge: 2014-10-20 | DRG: 765 | Disposition: A | Discharge status: Home / Self Care | Payer: Medicaid Other | Source: Ambulatory Visit | Attending: Family Medicine | Admitting: Family Medicine

## 2014-10-16 VITALS — BP 124/68 | HR 91

## 2014-10-16 DIAGNOSIS — O99334 Smoking (tobacco) complicating childbirth: Secondary | ICD-10-CM | POA: Diagnosis present

## 2014-10-16 DIAGNOSIS — O4693 Antepartum hemorrhage, unspecified, third trimester: Secondary | ICD-10-CM | POA: Diagnosis present

## 2014-10-16 DIAGNOSIS — O3421 Maternal care for scar from previous cesarean delivery: Principal | ICD-10-CM | POA: Diagnosis present

## 2014-10-16 DIAGNOSIS — O99333 Smoking (tobacco) complicating pregnancy, third trimester: Secondary | ICD-10-CM | POA: Diagnosis not present

## 2014-10-16 DIAGNOSIS — F1190 Opioid use, unspecified, uncomplicated: Secondary | ICD-10-CM | POA: Diagnosis present

## 2014-10-16 DIAGNOSIS — Z3A35 35 weeks gestation of pregnancy: Secondary | ICD-10-CM | POA: Diagnosis present

## 2014-10-16 DIAGNOSIS — F17200 Nicotine dependence, unspecified, uncomplicated: Secondary | ICD-10-CM | POA: Diagnosis present

## 2014-10-16 DIAGNOSIS — O99324 Drug use complicating childbirth: Secondary | ICD-10-CM | POA: Diagnosis present

## 2014-10-16 DIAGNOSIS — O34219 Maternal care for unspecified type scar from previous cesarean delivery: Secondary | ICD-10-CM | POA: Diagnosis present

## 2014-10-16 DIAGNOSIS — F1120 Opioid dependence, uncomplicated: Secondary | ICD-10-CM | POA: Diagnosis not present

## 2014-10-16 DIAGNOSIS — O09523 Supervision of elderly multigravida, third trimester: Secondary | ICD-10-CM | POA: Diagnosis not present

## 2014-10-16 DIAGNOSIS — O4690 Antepartum hemorrhage, unspecified, unspecified trimester: Secondary | ICD-10-CM | POA: Diagnosis present

## 2014-10-16 DIAGNOSIS — J449 Chronic obstructive pulmonary disease, unspecified: Secondary | ICD-10-CM | POA: Diagnosis present

## 2014-10-16 DIAGNOSIS — O9952 Diseases of the respiratory system complicating childbirth: Secondary | ICD-10-CM | POA: Diagnosis present

## 2014-10-16 DIAGNOSIS — O365990 Maternal care for other known or suspected poor fetal growth, unspecified trimester, not applicable or unspecified: Secondary | ICD-10-CM | POA: Diagnosis present

## 2014-10-16 DIAGNOSIS — O365930 Maternal care for other known or suspected poor fetal growth, third trimester, not applicable or unspecified: Secondary | ICD-10-CM | POA: Diagnosis present

## 2014-10-16 DIAGNOSIS — O99320 Drug use complicating pregnancy, unspecified trimester: Secondary | ICD-10-CM | POA: Diagnosis present

## 2014-10-16 LAB — CBC
HCT: 34.8 % — ABNORMAL LOW (ref 36.0–46.0)
HEMOGLOBIN: 12 g/dL (ref 12.0–15.0)
MCH: 31.3 pg (ref 26.0–34.0)
MCHC: 34.5 g/dL (ref 30.0–36.0)
MCV: 90.9 fL (ref 78.0–100.0)
Platelets: 262 10*3/uL (ref 150–400)
RBC: 3.83 MIL/uL — AB (ref 3.87–5.11)
RDW: 12.6 % (ref 11.5–15.5)
WBC: 10.5 10*3/uL (ref 4.0–10.5)

## 2014-10-16 LAB — TYPE AND SCREEN
ABO/RH(D): O POS
Antibody Screen: NEGATIVE

## 2014-10-16 LAB — ABO/RH: ABO/RH(D): O POS

## 2014-10-16 LAB — CULTURE, BETA STREP (GROUP B ONLY)

## 2014-10-16 MED ORDER — DOCUSATE SODIUM 100 MG PO CAPS
100.0000 mg | ORAL_CAPSULE | Freq: Every day | ORAL | Status: DC
Start: 2014-10-16 — End: 2014-10-20
  Administered 2014-10-16 – 2014-10-20 (×5): 100 mg via ORAL
  Filled 2014-10-16 (×5): qty 1

## 2014-10-16 MED ORDER — SODIUM CHLORIDE 0.9 % IJ SOLN
3.0000 mL | Freq: Two times a day (BID) | INTRAMUSCULAR | Status: DC
  Administered 2014-10-16 – 2014-10-17 (×3): 3 mL via INTRAVENOUS

## 2014-10-16 MED ORDER — SODIUM CHLORIDE 0.9 % IJ SOLN
3.0000 mL | INTRAMUSCULAR | Status: DC | PRN
  Administered 2014-10-17: 3 mL via INTRAVENOUS
  Filled 2014-10-16: qty 3

## 2014-10-16 MED ORDER — ONDANSETRON 4 MG PO TBDP
4.0000 mg | ORAL_TABLET | Freq: Four times a day (QID) | ORAL | Status: DC | PRN
  Administered 2014-10-16: 4 mg via ORAL
  Filled 2014-10-16 (×2): qty 1

## 2014-10-16 MED ORDER — PANTOPRAZOLE SODIUM 40 MG PO TBEC
40.0000 mg | DELAYED_RELEASE_TABLET | Freq: Every day | ORAL | Status: DC
  Administered 2014-10-16 – 2014-10-20 (×4): 40 mg via ORAL
  Filled 2014-10-16 (×4): qty 1

## 2014-10-16 MED ORDER — METHADONE HCL 5 MG/5ML PO SOLN
160.0000 mg | Freq: Every day | ORAL | Status: DC
  Filled 2014-10-16: qty 500

## 2014-10-16 MED ORDER — ZOLPIDEM TARTRATE 5 MG PO TABS
5.0000 mg | ORAL_TABLET | Freq: Every evening | ORAL | Status: DC | PRN
Start: 2014-10-16 — End: 2014-10-20
  Administered 2014-10-16 – 2014-10-19 (×4): 5 mg via ORAL
  Filled 2014-10-16 (×4): qty 1

## 2014-10-16 MED ORDER — SODIUM CHLORIDE 0.9 % IV SOLN: 250.0000 mL | INTRAVENOUS | Status: DC | PRN

## 2014-10-16 MED ORDER — METHADONE HCL 10 MG/5ML PO SOLN
160.0000 mg | Freq: Every day | ORAL | Status: DC
  Filled 2014-10-16 (×7): qty 80

## 2014-10-16 MED ORDER — METHADONE HCL 10 MG/5ML PO SOLN: 160.0000 mg | Freq: Every day | ORAL | Status: DC

## 2014-10-16 MED ORDER — CALCIUM CARBONATE ANTACID 500 MG PO CHEW: 2.0000 | CHEWABLE_TABLET | ORAL | Status: DC | PRN

## 2014-10-16 MED ORDER — AMPHETAMINE-DEXTROAMPHETAMINE 20 MG PO TABS: 20.0000 mg | ORAL_TABLET | Freq: Three times a day (TID) | ORAL | Status: DC

## 2014-10-16 MED ORDER — TERBUTALINE SULFATE 1 MG/ML IJ SOLN
0.2500 mg | Freq: Once | INTRAMUSCULAR | Status: AC
  Administered 2014-10-16: 0.25 mg via SUBCUTANEOUS
  Filled 2014-10-16: qty 1

## 2014-10-16 MED ORDER — PRENATAL MULTIVITAMIN CH
1.0000 | ORAL_TABLET | Freq: Every day | ORAL | Status: DC
  Administered 2014-10-16 – 2014-10-20 (×2): 1 via ORAL
  Filled 2014-10-16 (×4): qty 1

## 2014-10-16 MED ORDER — ALBUTEROL SULFATE (2.5 MG/3ML) 0.083% IN NEBU
2.5000 mg | INHALATION_SOLUTION | Freq: Four times a day (QID) | RESPIRATORY_TRACT | Status: DC | PRN
  Administered 2014-10-18: 2.5 mg via RESPIRATORY_TRACT
  Filled 2014-10-16: qty 3

## 2014-10-16 MED ORDER — ACETAMINOPHEN 325 MG PO TABS
650.0000 mg | ORAL_TABLET | ORAL | Status: DC | PRN
  Administered 2014-10-17 (×2): 650 mg via ORAL
  Filled 2014-10-16 (×2): qty 2

## 2014-10-16 NOTE — Progress Notes (Signed)
Dr. Shawnie PonsPratt in to see pt following NST.

## 2014-10-16 NOTE — MAU Note (Signed)
Pt left AMA pt informed of risks of not staying for observation. Pt informed that there is an increase of medical problems and fetal/maternal death signing out against medical advice. Pt informed that she should return to MAU for increase vaginal bleeding, SROM, decreased fetal movement, and increased abdominal pain or contraction.

## 2014-10-16 NOTE — Progress Notes (Signed)
Ur chart review completed.  

## 2014-10-16 NOTE — H&P (Signed)
Chief Complaint  Vaginal bleeding      HPI Ms Paige Bailey is a 38yo [id number] @ 35 2/7 wks who presents for eval of vag bldg that began suddenly last evening @ 6pm while shopping. She reports that it went through her underwear and into her pants. Denies s/s of ROM; reports having ctx that have been the same as they have been all week. Had cx exam on 12/21 of 1.5cm and BPP 8/8 today at [facility]. Denies illicit drug use; no recent IC. Did not stay last pm, but comes back today for admission.  Continues to have vaginal bleeding. 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 Adult ADHD   . COPD (chronic obstructive pulmonary disease)     Past Surgical History  Procedure Laterality Date  . Cesarean section    . Appendectomy      Family History  Problem Relation Age of Onset  . Hyperlipidemia Father   . Heart disease Father   . Hypertension Father   . Stroke Father   . Heart disease Brother   . Hyperlipidemia Brother   . Hypertension Brother     History  Substance Use Topics  . Smoking status: Current Every Day Smoker -- 0.50 packs/day  . Smokeless tobacco: Never Used  . Alcohol Use: No    Allergies:  Allergies  Allergen Reactions  . Phenergan [Promethazine Hcl]     Legs shaking     Facility-administered medications prior to admission  Medication Dose Route Frequency Provider Last Rate Last Dose  . hydroxyprogesterone caproate  (DELALUTIN) 250 mg/mL injection 250 mg 250 mg Intramuscular Weekly Rhona RaiderJacob J Stinson, DO  250 mg at 10/10/14 1025   Prescriptions prior to admission  Medication Sig Dispense Refill Last Dose  . albuterol (PROVENTIL HFA;VENTOLIN HFA) 108 (90 BASE) MCG/ACT inhaler Inhale 2 puffs into the lungs every 6 (six) hours as needed for wheezing or shortness of breath.    Taking  . amphetamine-dextroamphetamine (ADDERALL) 20 MG tablet Take 20 mg by mouth 3 (three) times daily.   Taking  . docusate sodium (COLACE) 100 MG capsule Take 1 capsule (100 mg total) by mouth 2 (two) times daily. 60 capsule 5 Taking  . hydroxyprogesterone caproate (DELALUTIN) 250 mg/mL OIL injection Inject 250 mg into the muscle once.   Taking  . methadone (DOLOPHINE) 10 MG/5ML solution Take 160 mg by mouth every 6 (six) hours as needed for pain.   Taking  . omeprazole (PRILOSEC) 40 MG capsule Take 1 capsule (40 mg total) by mouth 2 (two) times daily. 180 capsule 5 Taking  . ondansetron (ZOFRAN ODT) 4 MG disintegrating tablet Take 1 tablet (4 mg total) by mouth every 6 (six) hours as needed for nausea. 20 tablet 5 Taking  . prenatal vitamin w/FE, FA (PRENATAL 1 + 1) 27-1 MG TABS tablet Take 1 tablet by mouth daily at 12 noon. 30 each 2 Taking    ROS  Physical Exam   There were no vitals taken for this visit.  Physical Exam  Constitutional: She is oriented to person, place, and time. She appears well-developed.  HENT:  Head: Normocephalic.  Neck: Normal range of motion.  Cardiovascular: Normal rate.  Respiratory: Effort normal.  GI:  EFM 135-145, +accels, no decels Ctx irreg 3-6 mins  Genitourinary:  SE: sm blood at cx, dried bld on perineum, cx post loose 1/70/-2  Musculoskeletal: Normal range of motion.  Neurological: She is alert and oriented to person, place, and time.  Skin: Skin is warm and dry.  Psychiatric: She has a normal mood and affect. Her  behavior is normal. Thought content normal.  NST-reactive  Assessment and Plan   Principal Problem:   Vaginal bleeding in pregnancy Active Problems:   Previous cesarean delivery affecting pregnancy, antepartum   Drug dependence complicating pregnancy   Methadone maintenance treatment complicating pregnancy, antepartum   Smoker   Intrauterine growth restriction affecting antepartum care of mother  Admission and watch for s/sx's or worsening bleeding or compromised fetal status, with delivery for such

## 2014-10-16 NOTE — Progress Notes (Signed)
Pt. In room, but having a difficult time coping. Pt. Is tearful. She states she needs to be by herself for a bit. Will come back to re-assess.

## 2014-10-17 ENCOUNTER — Inpatient Hospital Stay (HOSPITAL_COMMUNITY): Payer: Medicaid Other | Admitting: Anesthesiology

## 2014-10-17 ENCOUNTER — Encounter (HOSPITAL_COMMUNITY): Payer: Self-pay | Admitting: Anesthesiology

## 2014-10-17 ENCOUNTER — Encounter (HOSPITAL_COMMUNITY)
Admission: AD | Disposition: A | Discharge status: Home / Self Care | Payer: Self-pay | Source: Ambulatory Visit | Attending: Family Medicine

## 2014-10-17 DIAGNOSIS — O365930 Maternal care for other known or suspected poor fetal growth, third trimester, not applicable or unspecified: Secondary | ICD-10-CM

## 2014-10-17 DIAGNOSIS — O3421 Maternal care for scar from previous cesarean delivery: Secondary | ICD-10-CM

## 2014-10-17 DIAGNOSIS — Z3A35 35 weeks gestation of pregnancy: Secondary | ICD-10-CM

## 2014-10-17 DIAGNOSIS — F1120 Opioid dependence, uncomplicated: Secondary | ICD-10-CM

## 2014-10-17 DIAGNOSIS — O99324 Drug use complicating childbirth: Secondary | ICD-10-CM

## 2014-10-17 LAB — RAPID URINE DRUG SCREEN, HOSP PERFORMED
AMPHETAMINES: POSITIVE — AB
BARBITURATES: NOT DETECTED
Benzodiazepines: NOT DETECTED
Cocaine: NOT DETECTED
OPIATES: NOT DETECTED
TETRAHYDROCANNABINOL: NOT DETECTED

## 2014-10-17 SURGERY — Surgical Case
Anesthesia: Spinal | Site: Abdomen

## 2014-10-17 MED ORDER — MIDAZOLAM HCL 2 MG/2ML IJ SOLN
INTRAMUSCULAR | Status: DC | PRN
  Administered 2014-10-17 (×2): 1 mg via INTRAVENOUS
  Administered 2014-10-17: 2 mg via INTRAVENOUS
  Administered 2014-10-17 (×2): 1 mg via INTRAVENOUS
  Administered 2014-10-17: 2 mg via INTRAVENOUS

## 2014-10-17 MED ORDER — MORPHINE SULFATE (PF) 0.5 MG/ML IJ SOLN: INTRAMUSCULAR | Status: DC | PRN

## 2014-10-17 MED ORDER — MIDAZOLAM HCL 2 MG/2ML IJ SOLN
INTRAMUSCULAR | Status: AC
  Filled 2014-10-17: qty 2

## 2014-10-17 MED ORDER — NALBUPHINE HCL 10 MG/ML IJ SOLN
5.0000 mg | INTRAMUSCULAR | Status: DC | PRN
  Filled 2014-10-17: qty 0.5

## 2014-10-17 MED ORDER — DIPHENHYDRAMINE HCL 25 MG PO CAPS: 25.0000 mg | ORAL_CAPSULE | Freq: Four times a day (QID) | ORAL | Status: DC | PRN

## 2014-10-17 MED ORDER — CITRIC ACID-SODIUM CITRATE 334-500 MG/5ML PO SOLN
ORAL | Status: AC
  Administered 2014-10-17: 30 mL
  Filled 2014-10-17: qty 15

## 2014-10-17 MED ORDER — NALOXONE HCL 1 MG/ML IJ SOLN
1.0000 ug/kg/h | INTRAMUSCULAR | Status: DC | PRN
  Filled 2014-10-17: qty 2

## 2014-10-17 MED ORDER — ONDANSETRON HCL 4 MG/2ML IJ SOLN
INTRAMUSCULAR | Status: AC
  Filled 2014-10-17: qty 2

## 2014-10-17 MED ORDER — PNEUMOCOCCAL VAC POLYVALENT 25 MCG/0.5ML IJ INJ
0.5000 mL | INJECTION | INTRAMUSCULAR | Status: AC
  Administered 2014-10-20: 0.5 mL via INTRAMUSCULAR
  Filled 2014-10-17: qty 0.5

## 2014-10-17 MED ORDER — LACTATED RINGERS IV SOLN
INTRAVENOUS | Status: DC
  Administered 2014-10-17: 11:00:00 via INTRAVENOUS

## 2014-10-17 MED ORDER — TETANUS-DIPHTH-ACELL PERTUSSIS 5-2.5-18.5 LF-MCG/0.5 IM SUSP
0.5000 mL | Freq: Once | INTRAMUSCULAR | Status: AC
  Administered 2014-10-18: 0.5 mL via INTRAMUSCULAR

## 2014-10-17 MED ORDER — OXYTOCIN 10 UNIT/ML IJ SOLN
40.0000 [IU] | INTRAMUSCULAR | Status: DC | PRN
  Administered 2014-10-17: 40 [IU] via INTRAVENOUS

## 2014-10-17 MED ORDER — SCOPOLAMINE 1 MG/3DAYS TD PT72
1.0000 | MEDICATED_PATCH | Freq: Once | TRANSDERMAL | Status: AC
  Administered 2014-10-17: 1.5 mg via TRANSDERMAL
  Filled 2014-10-17: qty 1

## 2014-10-17 MED ORDER — LACTATED RINGERS IV SOLN
INTRAVENOUS | Status: DC | PRN
  Administered 2014-10-17 (×3): via INTRAVENOUS

## 2014-10-17 MED ORDER — WITCH HAZEL-GLYCERIN EX PADS: 1.0000 "application " | MEDICATED_PAD | CUTANEOUS | Status: DC | PRN

## 2014-10-17 MED ORDER — SIMETHICONE 80 MG PO CHEW: 80.0000 mg | CHEWABLE_TABLET | ORAL | Status: DC

## 2014-10-17 MED ORDER — BUPIVACAINE HCL (PF) 0.5 % IJ SOLN
INTRAMUSCULAR | Status: AC
Start: 2014-10-17 — End: 2014-10-17
  Filled 2014-10-17: qty 30

## 2014-10-17 MED ORDER — PHENYLEPHRINE HCL 10 MG/ML IJ SOLN
INTRAMUSCULAR | Status: AC
  Filled 2014-10-17: qty 1

## 2014-10-17 MED ORDER — NALBUPHINE HCL 10 MG/ML IJ SOLN: 5.0000 mg | Freq: Once | INTRAMUSCULAR | Status: DC | PRN

## 2014-10-17 MED ORDER — FENTANYL CITRATE 0.05 MG/ML IJ SOLN
INTRAMUSCULAR | Status: DC | PRN
  Administered 2014-10-17: 50 ug via INTRAVENOUS
  Administered 2014-10-17: 10 ug via INTRATHECAL
  Administered 2014-10-17: 40 ug via INTRAVENOUS
  Administered 2014-10-17 (×2): 50 ug via INTRAVENOUS

## 2014-10-17 MED ORDER — MEPERIDINE HCL 25 MG/ML IJ SOLN: 6.2500 mg | INTRAMUSCULAR | Status: DC | PRN

## 2014-10-17 MED ORDER — ONDANSETRON HCL 4 MG/2ML IJ SOLN
INTRAMUSCULAR | Status: DC | PRN
  Administered 2014-10-17: 4 mg via INTRAVENOUS

## 2014-10-17 MED ORDER — SENNOSIDES-DOCUSATE SODIUM 8.6-50 MG PO TABS
2.0000 | ORAL_TABLET | ORAL | Status: DC
  Administered 2014-10-18 – 2014-10-19 (×3): 2 via ORAL
  Filled 2014-10-17 (×3): qty 2

## 2014-10-17 MED ORDER — DIPHENHYDRAMINE HCL 50 MG/ML IJ SOLN: 12.5000 mg | INTRAMUSCULAR | Status: DC | PRN

## 2014-10-17 MED ORDER — OXYTOCIN 10 UNIT/ML IJ SOLN
INTRAMUSCULAR | Status: AC
  Filled 2014-10-17: qty 4

## 2014-10-17 MED ORDER — FENTANYL CITRATE 0.05 MG/ML IJ SOLN
INTRAMUSCULAR | Status: AC
  Filled 2014-10-17: qty 2

## 2014-10-17 MED ORDER — LACTATED RINGERS IV SOLN: INTRAVENOUS | Status: DC

## 2014-10-17 MED ORDER — PRENATAL MULTIVITAMIN CH
1.0000 | ORAL_TABLET | Freq: Every day | ORAL | Status: DC
Start: 2014-10-18 — End: 2014-10-17

## 2014-10-17 MED ORDER — OXYCODONE-ACETAMINOPHEN 5-325 MG PO TABS
1.0000 | ORAL_TABLET | ORAL | Status: DC | PRN
  Filled 2014-10-17 (×3): qty 1

## 2014-10-17 MED ORDER — ACETAMINOPHEN 10 MG/ML IV SOLN
1000.0000 mg | Freq: Once | INTRAVENOUS | Status: AC
  Administered 2014-10-17: 1000 mg via INTRAVENOUS
  Filled 2014-10-17: qty 100

## 2014-10-17 MED ORDER — CEFAZOLIN SODIUM-DEXTROSE 2-3 GM-% IV SOLR
2.0000 g | Freq: Once | INTRAVENOUS | Status: DC
  Filled 2014-10-17: qty 50

## 2014-10-17 MED ORDER — IBUPROFEN 800 MG PO TABS
800.0000 mg | ORAL_TABLET | Freq: Three times a day (TID) | ORAL | Status: DC
  Administered 2014-10-17 – 2014-10-20 (×9): 800 mg via ORAL
  Filled 2014-10-17 (×9): qty 1

## 2014-10-17 MED ORDER — MORPHINE SULFATE 0.5 MG/ML IJ SOLN
INTRAMUSCULAR | Status: AC
  Filled 2014-10-17: qty 10

## 2014-10-17 MED ORDER — ONDANSETRON HCL 4 MG/2ML IJ SOLN: 4.0000 mg | Freq: Three times a day (TID) | INTRAMUSCULAR | Status: DC | PRN

## 2014-10-17 MED ORDER — DIBUCAINE 1 % RE OINT: 1.0000 "application " | TOPICAL_OINTMENT | RECTAL | Status: DC | PRN

## 2014-10-17 MED ORDER — SIMETHICONE 80 MG PO CHEW: 80.0000 mg | CHEWABLE_TABLET | ORAL | Status: DC | PRN

## 2014-10-17 MED ORDER — SCOPOLAMINE 1 MG/3DAYS TD PT72
MEDICATED_PATCH | TRANSDERMAL | Status: AC
  Filled 2014-10-17: qty 1

## 2014-10-17 MED ORDER — METHADONE HCL 10 MG/ML PO CONC
160.0000 mg | ORAL | Status: DC
  Administered 2014-10-17 – 2014-10-20 (×4): 160 mg via ORAL
  Filled 2014-10-17 (×5): qty 16

## 2014-10-17 MED ORDER — ZOLPIDEM TARTRATE 5 MG PO TABS: 5.0000 mg | ORAL_TABLET | Freq: Every evening | ORAL | Status: DC | PRN

## 2014-10-17 MED ORDER — ONDANSETRON HCL 4 MG PO TABS: 4.0000 mg | ORAL_TABLET | ORAL | Status: DC | PRN

## 2014-10-17 MED ORDER — OXYTOCIN 40 UNITS IN LACTATED RINGERS INFUSION - SIMPLE MED: 62.5000 mL/h | INTRAVENOUS | Status: AC

## 2014-10-17 MED ORDER — PHENYLEPHRINE 8 MG IN D5W 100 ML (0.08MG/ML) PREMIX OPTIME
INJECTION | INTRAVENOUS | Status: DC | PRN
  Administered 2014-10-17: 60 ug/min via INTRAVENOUS

## 2014-10-17 MED ORDER — ACETAMINOPHEN 10 MG/ML IV SOLN: 1000.0000 mg | Freq: Once | INTRAVENOUS | Status: DC

## 2014-10-17 MED ORDER — OXYCODONE HCL 5 MG PO TABS
5.0000 mg | ORAL_TABLET | Freq: Once | ORAL | Status: AC
  Administered 2014-10-17: 5 mg via ORAL
  Filled 2014-10-17: qty 1

## 2014-10-17 MED ORDER — IBUPROFEN 600 MG PO TABS
600.0000 mg | ORAL_TABLET | Freq: Four times a day (QID) | ORAL | Status: DC
  Administered 2014-10-17: 600 mg via ORAL
  Filled 2014-10-17: qty 1

## 2014-10-17 MED ORDER — LANOLIN HYDROUS EX OINT: 1.0000 "application " | TOPICAL_OINTMENT | CUTANEOUS | Status: DC | PRN

## 2014-10-17 MED ORDER — MENTHOL 3 MG MT LOZG: 1.0000 | LOZENGE | OROMUCOSAL | Status: DC | PRN

## 2014-10-17 MED ORDER — DIPHENHYDRAMINE HCL 25 MG PO CAPS: 25.0000 mg | ORAL_CAPSULE | ORAL | Status: DC | PRN

## 2014-10-17 MED ORDER — CEFAZOLIN SODIUM-DEXTROSE 2-3 GM-% IV SOLR
INTRAVENOUS | Status: DC | PRN
  Administered 2014-10-17: 2 g via INTRAVENOUS

## 2014-10-17 MED ORDER — OXYCODONE-ACETAMINOPHEN 5-325 MG PO TABS
2.0000 | ORAL_TABLET | ORAL | Status: DC | PRN
  Administered 2014-10-18 – 2014-10-20 (×12): 2 via ORAL
  Filled 2014-10-17 (×11): qty 2

## 2014-10-17 MED ORDER — ACETAMINOPHEN 500 MG PO TABS
1000.0000 mg | ORAL_TABLET | Freq: Four times a day (QID) | ORAL | Status: DC | PRN
  Administered 2014-10-17: 1000 mg via ORAL
  Filled 2014-10-17: qty 2

## 2014-10-17 MED ORDER — ONDANSETRON HCL 4 MG/2ML IJ SOLN: 4.0000 mg | INTRAMUSCULAR | Status: DC | PRN

## 2014-10-17 MED ORDER — MORPHINE SULFATE (PF) 0.5 MG/ML IJ SOLN
INTRAMUSCULAR | Status: DC | PRN
  Administered 2014-10-17 (×4): 2 mg via EPIDURAL
  Administered 2014-10-17: .2 mg via INTRATHECAL
  Administered 2014-10-17: 1.8 mg via INTRAVENOUS

## 2014-10-17 MED ORDER — SIMETHICONE 80 MG PO CHEW
80.0000 mg | CHEWABLE_TABLET | Freq: Three times a day (TID) | ORAL | Status: DC
  Administered 2014-10-17 – 2014-10-20 (×8): 80 mg via ORAL
  Filled 2014-10-17 (×8): qty 1

## 2014-10-17 MED ORDER — BUPIVACAINE IN DEXTROSE 0.75-8.25 % IT SOLN
INTRATHECAL | Status: DC | PRN
  Administered 2014-10-17: 1.6 mL via INTRATHECAL

## 2014-10-17 SURGICAL SUPPLY — 31 items
BARRIER ADHS 3X4 INTERCEED (GAUZE/BANDAGES/DRESSINGS) IMPLANT
BENZOIN TINCTURE PRP APPL 2/3 (GAUZE/BANDAGES/DRESSINGS) ×3 IMPLANT
CLAMP CORD UMBIL (MISCELLANEOUS) IMPLANT
CLOSURE WOUND 1/2 X4 (GAUZE/BANDAGES/DRESSINGS) ×1
CLOTH BEACON ORANGE TIMEOUT ST (SAFETY) ×3 IMPLANT
DRAPE SHEET LG 3/4 BI-LAMINATE (DRAPES) ×3 IMPLANT
DRSG OPSITE POSTOP 4X10 (GAUZE/BANDAGES/DRESSINGS) ×3 IMPLANT
DURAPREP 26ML APPLICATOR (WOUND CARE) ×3 IMPLANT
ELECT REM PT RETURN 9FT ADLT (ELECTROSURGICAL) ×3
ELECTRODE REM PT RTRN 9FT ADLT (ELECTROSURGICAL) ×1 IMPLANT
EXTRACTOR VACUUM KIWI (MISCELLANEOUS) IMPLANT
GLOVE BIO SURGEON STRL SZ 6.5 (GLOVE) ×2 IMPLANT
GLOVE BIO SURGEONS STRL SZ 6.5 (GLOVE) ×1
GLOVE BIOGEL PI IND STRL 7.0 (GLOVE) ×1 IMPLANT
GLOVE BIOGEL PI INDICATOR 7.0 (GLOVE) ×2
GOWN STRL REUS W/TWL LRG LVL3 (GOWN DISPOSABLE) ×6 IMPLANT
KIT ABG SYR 3ML LUER SLIP (SYRINGE) ×3 IMPLANT
NEEDLE HYPO 25X5/8 SAFETYGLIDE (NEEDLE) ×3 IMPLANT
NS IRRIG 1000ML POUR BTL (IV SOLUTION) ×3 IMPLANT
PACK C SECTION WH (CUSTOM PROCEDURE TRAY) ×3 IMPLANT
PAD OB MATERNITY 4.3X12.25 (PERSONAL CARE ITEMS) ×3 IMPLANT
SPONGE LAP 18X18 X RAY DECT (DISPOSABLE) ×3 IMPLANT
STRIP CLOSURE SKIN 1/2X4 (GAUZE/BANDAGES/DRESSINGS) ×2 IMPLANT
SUT PDS AB 0 CTX 36 PDP370T (SUTURE) ×3 IMPLANT
SUT VIC AB 0 CT1 36 (SUTURE) ×24 IMPLANT
SUT VIC AB 2-0 CT1 27 (SUTURE) ×2
SUT VIC AB 2-0 CT1 TAPERPNT 27 (SUTURE) ×1 IMPLANT
SUT VIC AB 4-0 PS2 27 (SUTURE) ×3 IMPLANT
TOWEL OR 17X24 6PK STRL BLUE (TOWEL DISPOSABLE) ×3 IMPLANT
TRAY FOLEY CATH 14FR (SET/KITS/TRAYS/PACK) ×3 IMPLANT
WATER STERILE IRR 1000ML POUR (IV SOLUTION) ×3 IMPLANT

## 2014-10-17 NOTE — Progress Notes (Signed)
RN into eval pt & perform am assessment.  Pt currently out of room.

## 2014-10-17 NOTE — Progress Notes (Addendum)
Decision for Cesarean Section secondary pt laboring and requires repeat

## 2014-10-17 NOTE — Transfer of Care (Signed)
Immediate Anesthesia Transfer of Care Note  Patient: Paige Bailey  Procedure(s) Performed: Procedure(s): CESAREAN SECTION (N/A)  Patient Location: PACU  Anesthesia Type:Spinal  Level of Consciousness: awake, alert  and oriented  Airway & Oxygen Therapy: Patient Spontanous Breathing  Post-op Assessment: Report given to PACU RN, Post -op Vital signs reviewed and stable and Patient moving all extremities  Post vital signs: Reviewed and stable  Complications: No apparent anesthesia complications

## 2014-10-17 NOTE — Anesthesia Preprocedure Evaluation (Signed)
Anesthesia Evaluation  Patient identified by MRN, date of birth, ID band Patient awake    Reviewed: Allergy & Precautions, H&P , NPO status , Patient's Chart, lab work & pertinent test results  History of Anesthesia Complications Negative for: history of anesthetic complications  Airway Mallampati: II  TM Distance: >3 FB Neck ROM: Full    Dental no notable dental hx. (+) Dental Advisory Given, Poor Dentition   Pulmonary Current Smoker,  breath sounds clear to auscultation  Pulmonary exam normal       Cardiovascular negative cardio ROS  Rhythm:Regular Rate:Normal     Neuro/Psych PSYCHIATRIC DISORDERS Anxiety Depression negative neurological ROS     GI/Hepatic negative GI ROS, (+)     substance abuse (currently on methadone 160 qday and took this am x 1 year, denies any current drug use)  ,   Endo/Other  negative endocrine ROS  Renal/GU negative Renal ROS  negative genitourinary   Musculoskeletal negative musculoskeletal ROS (+)   Abdominal   Peds negative pediatric ROS (+)  Hematology negative hematology ROS (+)   Anesthesia Other Findings   Reproductive/Obstetrics (+) Pregnancy                             Anesthesia Physical Anesthesia Plan  ASA: III and emergent  Anesthesia Plan: Spinal   Post-op Pain Management:    Induction:   Airway Management Planned:   Additional Equipment:   Intra-op Plan:   Post-operative Plan:   Informed Consent: I have reviewed the patients History and Physical, chart, labs and discussed the procedure including the risks, benefits and alternatives for the proposed anesthesia with the patient or authorized representative who has indicated his/her understanding and acceptance.   Dental advisory given  Plan Discussed with:   Anesthesia Plan Comments: (Discussed completely risks and benefits. Patient requests spinal anesthetic and labs  reviewed. She does have a panic disorder and reports that she will panic but did want to proceed with a spinal anesthetic. General anesthesia options reviewed and patient requests spinal)        Anesthesia Quick Evaluation

## 2014-10-17 NOTE — Progress Notes (Signed)
Pt to OR.

## 2014-10-17 NOTE — Progress Notes (Signed)
Called by the bedside nurse when the patient arrived on the the 3rd floor postop for pain management recommendations. Discussed immediately with Dr. Debroah LoopArnold and gave order for oxycodone as Dr. Debroah LoopArnold was in surgery. Dr. Debroah LoopArnold reports that he will manage the postoperative pain issues and his PA reports that orders were placed. Then I received a phone call directly from the patient in her room requesting to be seen. I immediately went to her room and evaluated her. We had a very calm, collected conversation about what pain medication she had already received detailing the intraoperative pain management and that the oxycodone she had just been given a few minutes prior had not reached effect yet. Discussed that she probably will need higher pain medication requirements given her history and that her pain will be addressed and treated. She did not want to continue to receive opioids for pain because of her substance abuse history. With her husband present, I told her that this was perfectly acceptable in the post operative period as she had just had major surgery. At no point in the conversation was she emotional, tachycardiac, or expressing any discomfort. She reports tenderness to palpation at the incision site. She requested I turn off her pitocin, as she felt this was the main contributor to her discomfort, which I told her was part of her obstetric management and she would have to discuss this with Dr. Debroah LoopArnold. She requested that I remove her foley catheter. I told her that post operatively her pain management would be done by her primary team which is per the conversation earlier and that I would have her nurse who was present to call her obstetric team. Her primary team can feel free to contact anesthesiology if any assistance is needed.

## 2014-10-17 NOTE — Plan of Care (Signed)
Problem: Phase I Progression Outcomes Goal: IS, TCDB as ordered Outcome: Not Met (add Reason) Patient refused

## 2014-10-17 NOTE — Progress Notes (Signed)
Paige Bailey is a 38 y.o. [id number] at 4267w3d by ultrasound admitted for vaginal bleeding  Subjective: Painful regular contractions  Objective: BP 116/61 mmHg  Pulse 74  Temp(Src) 97.8 F (36.6 C) (Oral)  Resp 20  Ht 5\' 9"  (1.753 m)  Wt 73.029 kg (161 lb)  BMI 23.76 kg/m2     Fetal Heart Rate A      Mode  External filed at 10/17/2014 0636    Baseline Rate (A)  150 bpm filed at 10/17/2014 0710    Variability  6-25 BPM filed at 10/17/2014 0710    Accelerations  15 x 15 filed at 10/17/2014 0710    Decelerations  Variable filed at 10/17/2014 0710     FHT:  UC:   regular, every 2 minutes SVE:   Dilation: 2.5 Exam by:: Dr. Debroah LoopArnold  Labs: Lab Results  Component Value Date   WBC 10.5 10/16/2014   HGB 12.0 10/16/2014   HCT 34.8* 10/16/2014   MCV 90.9 10/16/2014   PLT 262 10/16/2014    Assessment / Plan: Spontaneous labor, progressing normally. Previous CS for elective repeat  Labor: Progressing normally Preeclampsia:  no signs or symptoms of toxicity Fetal Wellbeing:  Category I Pain Control:  inadequate I/D:  n/a Anticipated MOD:  Repeat cesarean section.  The procedure and the risk of anesthesia, bleeding, infection, bowel and bladder injury were discussed and her questions were answered. She declines BTL   Lidiya Reise 10/17/2014, 10:31 AM

## 2014-10-17 NOTE — Anesthesia Postprocedure Evaluation (Signed)
  Anesthesia Post-op Note  Patient: Paige Bailey  Procedure(s) Performed: Procedure(s) (LRB): CESAREAN SECTION (N/A)  Patient Location: PACU  Anesthesia Type: Spinal  Level of Consciousness: awake and alert   Airway and Oxygen Therapy: Patient Spontanous Breathing  Post-op Pain: mild  Post-op Assessment: Post-op Vital signs reviewed, Patient's Cardiovascular Status Stable, Respiratory Function Stable, Patent Airway and No signs of Nausea or vomiting  Last Vitals:  Filed Vitals:   10/17/14 1345  BP:   Pulse:   Temp: 36.4 C  Resp:     Post-op Vital Signs: stable   Complications: No apparent anesthesia complications

## 2014-10-17 NOTE — OR Nursing (Signed)
Unable to obtain cord blood, Dr. Debroah LoopArnold confirmed okay.

## 2014-10-17 NOTE — Anesthesia Procedure Notes (Signed)
Spinal Patient location during procedure: OR Start time: 10/17/2014 11:05 AM End time: 10/17/2014 11:08 AM Staffing Anesthesiologist: Felipe DroneJUDD, Marea Reasner JENNETTE Preanesthetic Checklist Completed: patient identified, site marked, surgical consent, pre-op evaluation, timeout performed, IV checked, risks and benefits discussed and monitors and equipment checked Spinal Block Patient position: sitting Prep: DuraPrep Patient monitoring: continuous pulse ox, blood pressure and heart rate Approach: midline Injection technique: single-shot Needle Needle type: Sprotte  Needle gauge: 24 G Needle length: 9 cm Additional Notes Functioning IV was confirmed and monitors were applied. Sterile prep and drape, including hand hygiene and sterile gloves were used. The patient was positioned and the spine was prepped. The skin was anesthetized with lidocaine.  Free flow of clear CSF was obtained prior to injecting local anesthetic into the CSF.  The spinal needle aspirated freely following injection.  The needle was carefully withdrawn.  The patient tolerated the procedure well. Karie SchwalbeMary Ahron Hulbert, MD

## 2014-10-17 NOTE — Progress Notes (Signed)
Pt informed EFM needed to be applied secondary c/o ctxs.  Pt refused.I

## 2014-10-17 NOTE — Progress Notes (Signed)
Dr. Emelda FearFerguson on unit to eval pt c/o & status.  Pt not in room, has ambulated off unit.

## 2014-10-17 NOTE — Op Note (Signed)
Paige Bailey PROCEDURE DATE: 10/17/2014  PREOPERATIVE DIAGNOSES: Intrauterine pregnancy at 207w3d weeks gestation; repeat cesarean section, previous high transverse incision  POSTOPERATIVE DIAGNOSES: The same, T-uterine incision  PROCEDURE: Repeat Low Transverse Cesarean Section  SURGEON:  Dr. Debroah LoopArnold  ASSISTANT:  Perry MountACOSTA,Edee Nifong ROCIO, MD  ANESTHESIOLOGIST: Dr. Gentry RochJudd  INDICATIONS: Paige Bailey is a 38 y.o. [id number] at 167w3d here for cesarean section secondary to preterm labor with history of previous high transverse cesarean section and therefore contraindication to labor the indications listed under preoperative diagnoses; please see preoperative note for further details.  The risks of cesarean section were discussed with the patient including but were not limited to: bleeding which may require transfusion or reoperation; infection which may require antibiotics; injury to bowel, bladder, ureters or other surrounding organs; injury to the fetus; need for additional procedures including hysterectomy in the event of a life-threatening hemorrhage; placental abnormalities wth subsequent pregnancies, incisional problems, thromboembolic phenomenon and other postoperative/anesthesia complications.   The patient concurred with the proposed plan, giving informed written consent for the procedure.    FINDINGS:  Viable female infant in cephalic presentation.  Apgars 5 and 9.  Clear amniotic fluid.  Intact placenta, three vessel cord.  Normal uterus, fallopian tubes and ovaries bilaterally. weight 4 lb 3 oz (1900 g).     ANESTHESIA: Spinal INTRAVENOUS FLUIDS: 2000 ml ESTIMATED BLOOD LOSS: 300 ml URINE OUTPUT:  25 ml SPECIMENS: L&D COMPLICATIONS: None immediate  PROCEDURE IN DETAIL:  The patient preoperatively received intravenous antibiotics and had sequential compression devices applied to her lower extremities.  She was then taken to the operating room where spinal anesthesia was administered and  was found to be adequate. She was then placed in a dorsal supine position with a leftward tilt, and prepped and draped in a sterile manner.  A foley catheter was placed into her bladder and attached to constant gravity.  After an adequate timeout was performed, a Pfannenstiel skin incision was made with scalpel and carried through to the underlying layer of fascia. The fascia was incised in the midline, and this incision was extended bilaterally using the Mayo scissors.  Kocher clamps were applied to the superior aspect of the fascial incision and the underlying rectus muscles were dissected off bluntly. A similar process was carried out on the inferior aspect of the fascial incision. The rectus muscles were separated in the midline bluntly and the peritoneum was entered bluntly. Attention was turned to the lower uterine segment where a low transverse hysterotomy was made with a scalpel and extended bilaterally bluntly through an anterior placenta.  After multiple failed attempts to deliver the infant, the hysterotomy was extended anterior with T-incision, the infant then successfully delivered feet first.  The cord was clamped and cut and the infant was handed over to awaiting neonatology team. Uterine massage was then administered, and the placenta delivered intact with a three-vessel cord.  I attempted to draw cord gas but was unsuccessful. The uterus was then cleared of clot and debris.  The hysterotomy was closed with 0 Vicryl in a running locked fashion; the T-incision was closed with two layers followed by baseball stitch and the , and an imbricating layer was also placed with 0 Vicryl along lower transverse incision. The pelvis was cleared of all clot and debris. Hemostasis was confirmed on all surfaces.  The peritoneum and the muscles were reapproximated using 0 Vicryl interrupted stitches. The fascia was then closed using 0 PDS in a running fashion.  The subcutaneous layer  was irrigated.  The skin was  closed with a 4-0 Vicryl subcuticular stitch. The patient tolerated the procedure well. Sponge, lap, instrument and needle counts were correct x 2.  She was taken to the recovery room in stable condition.   Perry MountACOSTA,Brandn Mcgath ROCIO, MD 4:57 PM

## 2014-10-17 NOTE — Addendum Note (Signed)
Addendum  created 10/17/14 1713 by Felipe DroneMary Jennette Elloise Roark, MD   Modules edited: Clinical Notes   Clinical Notes:  File: [id number]; Pend: [id number]; Pend: [id number]

## 2014-10-18 ENCOUNTER — Encounter (HOSPITAL_COMMUNITY): Payer: Self-pay | Admitting: Certified Registered"

## 2014-10-18 ENCOUNTER — Encounter (HOSPITAL_COMMUNITY): Payer: Self-pay | Admitting: Obstetrics & Gynecology

## 2014-10-18 LAB — CBC
HCT: 34.3 % — ABNORMAL LOW (ref 36.0–46.0)
Hemoglobin: 11.8 g/dL — ABNORMAL LOW (ref 12.0–15.0)
MCH: 31.2 pg (ref 26.0–34.0)
MCHC: 34.4 g/dL (ref 30.0–36.0)
MCV: 90.7 fL (ref 78.0–100.0)
PLATELETS: 243 10*3/uL (ref 150–400)
RBC: 3.78 MIL/uL — AB (ref 3.87–5.11)
RDW: 12.6 % (ref 11.5–15.5)
WBC: 15.9 10*3/uL — AB (ref 4.0–10.5)

## 2014-10-18 LAB — RPR

## 2014-10-18 NOTE — Progress Notes (Signed)
Subjective:pain relief ok Postpartum Day 1: Cesarean Delivery Patient reports incisional pain and tolerating PO.    Objective: Vital signs in last 24 hours: Temp:  [97.6 F (36.4 C)-98.9 F (37.2 C)] 98.4 F (36.9 C) (12/25 0457) Pulse Rate:  [64-78] 66 (12/25 0457) Resp:  [13-40] 13 (12/25 0457) BP: (107-138)/(50-78) 129/64 mmHg (12/25 0457) SpO2:  [94 %-100 %] 100 % (12/25 0511)  Physical Exam:  General: alert, fatigued and no distress Lochia: appropriate Uterine Fundus: firm Incision: healing well DVT Evaluation: No evidence of DVT seen on physical exam.   Recent Labs  10/16/14 1330 10/18/14 0520  HGB 12.0 11.8*  HCT 34.8* 34.3*    Assessment/Plan: Status post Cesarean section. Doing well postoperatively.  Continue current care.  Paige Bailey 10/18/2014, 7:25 AM

## 2014-10-18 NOTE — Lactation Note (Signed)
This note was copied from the chart of Paige Bailey. Lactation Consultation Note     Mom not in her room. Randel Books is now 69 hours old, 35 4/7 weeks CGA, and in NICU. I was told  By [facility] nurse that mom has not started pumping. The pump was in the room, unplugged, and the kit not opened. I will try and see her later this afternoon, or tomorrow, and see what she how she plans to feed the baby. Patient Name: Paige Bailey SELTR'V Date: 10/18/2014 Reason for consult: Follow-up assessment;NICU baby;Initial assessment   Maternal Data Formula Feeding for Exclusion: Yes (baby in NICU) Has patient been taught Hand Expression?: No  Feeding Feeding Type: Formula Nipple Type: Slow - flow Length of feed: 25 min  LATCH Score/Interventions                      Lactation Tools Discussed/Used     Consult Status Consult Status: Follow-up Date: 10/19/14 Follow-up type: In-patient    Tonna Corner 10/18/2014, 2:58 PM

## 2014-10-18 NOTE — Anesthesia Postprocedure Evaluation (Signed)
  Anesthesia Post-op Note  Anesthesia Post Note  Patient: Paige Bailey  Procedure(s) Performed: Procedure(s) (LRB): CESAREAN SECTION (N/A)  Anesthesia type: Spinal  Patient location: Mother/Baby  Post pain: Pain level controlled  Post assessment: Post-op Vital signs reviewed  Last Vitals:  Filed Vitals:   10/18/14 1400  BP: 135/66  Pulse: 77  Temp: 37 C  Resp: 18    Post vital signs: Reviewed  Level of consciousness: awake  Complications: No apparent anesthesia complications

## 2014-10-18 NOTE — Addendum Note (Signed)
Addendum  created 10/18/14 1539 by Turner DanielsJennifer L Dekota Shenk, CRNA   Modules edited: Notes Section   Notes Section:  File: [id number]

## 2014-10-19 NOTE — Lactation Note (Signed)
This note was copied from the chart of Paige Naaman PlummerSherri Sullivant. Lactation Consultation Note   Follow up consult on this mom of a NICU baby, now 4150 hours old and 35 5/7 weeks CGA. Mom just pumped for the first time, and brought some milk to her baby in the NICU.  Patient Name: Paige Bailey ZOXWR'UToday's Date: 10/19/2014 Reason for consult: Follow-up assessment;NICU baby   Maternal Data    Feeding Feeding Type: Formula Nipple Type: Slow - flow Length of feed: 20 min  LATCH Score/Interventions                      Lactation Tools Discussed/Used     Consult Status Consult Status: Follow-up Date: 10/20/14 Follow-up type: In-patient    Alfred LevinsLee, Khalis Hittle Anne 10/19/2014, 1:58 PM

## 2014-10-19 NOTE — Progress Notes (Signed)
Subjective: some relief with pain med Postpartum Day 2: Cesarean Delivery Patient reports incisional pain, tolerating PO and no problems voiding.    Objective: Vital signs in last 24 hours: Temp:  [98 F (36.7 C)-98.7 F (37.1 C)] 98 F (36.7 C) (12/26 0612) Pulse Rate:  [72-92] 88 (12/26 0612) Resp:  [16-18] 18 (12/26 0612) BP: (126-138)/(56-67) 128/60 mmHg (12/26 0612) SpO2:  [98 %-100 %] 100 % (12/26 0612)  Physical Exam:  General: alert, cooperative and no distress Lochia: appropriate Uterine Fundus: firm Incision: healing well, no significant drainage DVT Evaluation: No evidence of DVT seen on physical exam.   Recent Labs  10/16/14 1330 10/18/14 0520  HGB 12.0 11.8*  HCT 34.8* 34.3*    Assessment/Plan: Status post Cesarean section. Doing well postoperatively.  Continue current care.  Paige Bailey 10/19/2014, 8:49 AM

## 2014-10-20 MED ORDER — IBUPROFEN 800 MG PO TABS: 800.0000 mg | ORAL_TABLET | Freq: Three times a day (TID) | ORAL | Status: DC

## 2014-10-20 MED ORDER — OXYCODONE-ACETAMINOPHEN 5-325 MG PO TABS: 1.0000 | ORAL_TABLET | ORAL | Status: DC | PRN

## 2014-10-20 NOTE — Progress Notes (Signed)
Discharge instructions provided to patient and significant other at bedside.  Activity, medications, incision care, follow up appointments, when to call the doctor and community resources discussed.  No questions at this time.  Patient left unit in stable condition with all personal belongings and prescriptions accompanied by staff.  K. Leveon Pelzer, RN------------------------  

## 2014-10-20 NOTE — Discharge Summary (Signed)
Obstetric Discharge Summary Reason for Admission:[id number] Admitted with vaginal bleeding, began to labor and repeat cesarean section performed Patient Active Problem List   Diagnosis Date Noted  . Vaginal bleeding in pregnancy 10/16/2014  . Vaginal bleeding during pregnancy, antepartum 10/16/2014  . Intrauterine growth restriction affecting antepartum care of mother   . Methadone maintenance treatment complicating pregnancy, antepartum 08/01/2014  . Methadone dependence 06/27/2014  . Heartburn during pregnancy in second trimester 06/27/2014  . Previous cesarean delivery affecting pregnancy, antepartum 05/30/2014  . AMA (advanced maternal age) multigravida 35+ 05/30/2014  . Supervision of high-risk pregnancy 05/30/2014  . Smoker 05/30/2014  . Drug dependence complicating pregnancy 05/30/2014    Prenatal Procedures: ultrasound Intrapartum Procedures: cesarean: low cervical, transverse Postpartum Procedures: none Complications-Operative and Postpartum: none HEMOGLOBIN  Date Value Ref Range Status  10/18/2014 11.8* 12.0 - 15.0 g/dL Final   HCT  Date Value Ref Range Status  10/18/2014 34.3* 36.0 - 46.0 % Final    Physical Exam:  General: alert, cooperative and no distress Lochia: appropriate Uterine Fundus: firm Incision: healing well, no significant drainage DVT Evaluation: No evidence of DVT seen on physical exam.  Discharge Diagnoses: preterm delivery  Discharge Information: Date: 10/20/2014 Activity: pelvic rest and no heavy lifting Diet: routine Medications: Ibuprofen and Percocet Condition: stable Instructions: refer to practice specific booklet Discharge to: home Follow-up Information    Follow up with [facility] In 6 weeks.   Contact information:   [address] [place] [address] [phone]       Newborn Data: Live born female  Birth Weight: 4 lb 3 oz (1900 g) APGAR: 5, 9  Home with NICU.  Taquan Bralley 10/20/2014, 7:33 AM

## 2014-10-20 NOTE — Discharge Instructions (Signed)

## 2014-10-22 ENCOUNTER — Inpatient Hospital Stay (HOSPITAL_COMMUNITY): Admission: RE | Admit: 2014-10-22 | Payer: Medicaid Other | Source: Ambulatory Visit

## 2014-10-22 ENCOUNTER — Encounter (HOSPITAL_COMMUNITY): Payer: Self-pay

## 2014-10-23 ENCOUNTER — Inpatient Hospital Stay (HOSPITAL_COMMUNITY)
Admission: RE | Admit: 2014-10-23 | Payer: Medicaid Other | Source: Ambulatory Visit | Admitting: Obstetrics & Gynecology

## 2014-10-23 ENCOUNTER — Ambulatory Visit: Payer: Self-pay

## 2014-10-23 ENCOUNTER — Encounter (HOSPITAL_COMMUNITY): Admission: RE | Payer: Self-pay | Source: Ambulatory Visit

## 2014-10-23 SURGERY — Surgical Case
Anesthesia: Regional

## 2014-10-23 NOTE — Lactation Note (Signed)
This note was copied from the chart of Paige Ashtynn Berke. Lactation Consultation Note Met with mom in the NICU to discuss her concerns about low milk supply.  Baby was delivered at 35.3 weeks on 10/17/14.  Mom started pumping on 10/19/14 but states she isnt always able to pump every 3 hours.  She reports obtaining a few mls but did obtain 20 mls once yesterday.  She did not breastfeed previous babies but remembers breasts becoming full.  Denies fullness or heaviness since birth of this baby.  Mom very open about her worry and concern about baby and the need for medications for withdrawal symptoms.  She states after the death of her 62 week old son in 2012 she started using drugs but is now on methadone.  Praised mom for seeking treatment and desire to provide breastmilk for her newborn.  Mom admits to feeling very stressed and teary eyed.  Discussed importance of pumping frequency, relaxing, heat and massage prior to and during pumping and drinking plenty of fluids and eating adequate calories.  She will try the above and notify us if no increase in next 48 hours.  Patient Name: Paige Bailey POEUM'P Date: 10/23/2014     Maternal Data    Feeding Feeding Type: Breast Milk with Formula added Nipple Type: Regular Length of feed: 15 min  LATCH Score/Interventions                      Lactation Tools Discussed/Used     Consult Status      Ave Filter 10/23/2014, 1:27 PM

## 2014-10-28 ENCOUNTER — Ambulatory Visit (INDEPENDENT_AMBULATORY_CARE_PROVIDER_SITE_OTHER): Payer: Medicaid Other

## 2014-10-28 ENCOUNTER — Encounter: Payer: Self-pay | Admitting: *Deleted

## 2014-10-28 VITALS — BP 136/73 | HR 82 | Temp 98.1°F

## 2014-10-28 DIAGNOSIS — R300 Dysuria: Secondary | ICD-10-CM

## 2014-10-28 LAB — POCT URINALYSIS DIP (DEVICE)
BILIRUBIN URINE: NEGATIVE
Glucose, UA: NEGATIVE mg/dL
KETONES UR: NEGATIVE mg/dL
Leukocytes, UA: NEGATIVE
Nitrite: NEGATIVE
Protein, ur: NEGATIVE mg/dL
Urobilinogen, UA: 0.2 mg/dL (ref 0.0–1.0)
pH: 5.5 (ref 5.0–8.0)

## 2014-10-28 NOTE — Progress Notes (Signed)
Patient here today for wound check. C/o of incisional pain-- reports pain becomes manageable with percocet or motrin. States she cannot see incision. Also c/o of pain and burning with urination. Urine specimen obtained. Urinalysis shows trace Hgb-- per Dr. Debroah Loop, culture urine. Upon inspection of incision-- site clean, dry and edges well approximated. Steri strips still present. No redness, drainage or odor. Incision appears to be healing well. Patient afebrile. Reassured patient of appropriately healing incision. Explained she is not experiencing any s/s of incisional infection. Explained that her discomfort is likely surgical discomfort which will subside with time and rest-- advised to continue taking motrin or percocet. Patient verbalized understanding. Informed patient urine will be sent for culture to check for infection there. Patient verbalized understanding and gratitude. No questions or concerns.

## 2014-10-30 LAB — URINE CULTURE
COLONY COUNT: NO GROWTH
Organism ID, Bacteria: NO GROWTH

## 2014-11-01 ENCOUNTER — Telehealth: Payer: Self-pay | Admitting: *Deleted

## 2014-11-01 NOTE — Telephone Encounter (Signed)
Pt called the clinic requesting urine results from Monday of this week.  Contacted patient and informed of results, pt verbalizes understanding, pt states she is still experiencing pain and may be a kidney stone.  Encouraged patient to go to [facility]U if she is in extreme pain.  Pt verbalizes understanding.

## 2014-11-08 ENCOUNTER — Encounter: Payer: Self-pay | Admitting: *Deleted

## 2014-11-27 ENCOUNTER — Telehealth: Payer: Self-pay | Admitting: Advanced Practice Midwife

## 2014-11-27 NOTE — Telephone Encounter (Signed)
Called to speak to social worker regarding drug treatment. SW not in clinic on Wednesdays. Has already been working w/ SW on this issue. Denies any urgent situation. Instructed pt to go to [facility] for urgent situation, otherwise call tomorrow BTW 8-12.

## 2014-12-06 ENCOUNTER — Ambulatory Visit (INDEPENDENT_AMBULATORY_CARE_PROVIDER_SITE_OTHER): Payer: Self-pay | Admitting: Obstetrics & Gynecology

## 2014-12-06 ENCOUNTER — Encounter: Payer: Self-pay | Admitting: Obstetrics & Gynecology

## 2014-12-06 VITALS — BP 107/72 | HR 83 | Temp 98.5°F | Ht 69.0 in | Wt 147.3 lb

## 2014-12-06 DIAGNOSIS — Z30013 Encounter for initial prescription of injectable contraceptive: Secondary | ICD-10-CM

## 2014-12-06 DIAGNOSIS — F53 Postpartum depression: Secondary | ICD-10-CM | POA: Insufficient documentation

## 2014-12-06 DIAGNOSIS — O99345 Other mental disorders complicating the puerperium: Secondary | ICD-10-CM

## 2014-12-06 MED ORDER — MEDROXYPROGESTERONE ACETATE 150 MG/ML IM SUSP
150.0000 mg | INTRAMUSCULAR | Status: AC
  Administered 2014-12-06 – 2015-08-20 (×3): 150 mg via INTRAMUSCULAR

## 2014-12-06 NOTE — Patient Instructions (Signed)

## 2014-12-06 NOTE — Progress Notes (Signed)
Patient ID: Paige RhineSherri L Bailey, female   DOB: May 01, 1976, 39 y.o.   MRN: [id number] Subjective:positive depression screen but states she is not depressed     Paige Bailey is a 39 y.o. female who presents for a postpartum visit. She is 6 weeks postpartum following a cesarean sith T incision. I have fully reviewed the prenatal and intrapartum course. The delivery was at 34.5 gestational weeks. Outcome: repeat cesarean section, classical incision. Anesthesia: spinal. Postpartum course has been complicated by incision not acceptable cosmetically. Baby's course has been good. Baby is feeding by bottle -  . Bleeding menses. Bowel function is normal. Bladder function is normal. Patient is sexually active. Contraception method is condoms. Postpartum depression screening: positive.  The following portions of the patient's history were reviewed and updated as appropriate: allergies, current medications, past family history, past medical history, past social history, past surgical history and problem list.  Review of Systems Pertinent items are noted in HPI.   Objective:    BP 107/72 mmHg  Pulse 83  Temp(Src) 98.5 F (36.9 C) (Oral)  Ht 5\' 9"  (1.753 m)  Wt 147 lb 4.8 oz (66.815 kg)  BMI 21.74 kg/m2  LMP 12/06/2014  Breastfeeding? No  General:  alert, cooperative and no distress   Breasts:           Abdomen: soft, non-tender; bowel sounds normal; no masses,  no organomegaly and incision healing well with some retraction noted   Vulva:  not evaluated  Vagina: not evaluated  Cervix:     Corpus: not examined  Adnexa:  not evaluated  Rectal Exam: Not performed.        Assessment:     normal postpartum exam. Pap smear not done at today's visit.   Plan:    1. Contraception: Depo-Provera injections 2. F/u counseling at methadone clinic 3. Follow up as needed. Advised incision may remodel and appear different after further weight loss  Adam PhenixJames G Arnold, MD 12/06/2014

## 2015-02-27 ENCOUNTER — Ambulatory Visit: Payer: Medicaid Other

## 2015-03-19 ENCOUNTER — Encounter: Payer: Self-pay | Admitting: *Deleted

## 2015-03-19 ENCOUNTER — Ambulatory Visit (INDEPENDENT_AMBULATORY_CARE_PROVIDER_SITE_OTHER): Payer: Medicaid Other | Admitting: *Deleted

## 2015-03-19 VITALS — BP 130/59 | HR 91 | Temp 98.8°F | Wt 144.3 lb

## 2015-03-19 DIAGNOSIS — Z3042 Encounter for surveillance of injectable contraceptive: Secondary | ICD-10-CM | POA: Diagnosis present

## 2015-03-19 DIAGNOSIS — Z3202 Encounter for pregnancy test, result negative: Secondary | ICD-10-CM | POA: Diagnosis not present

## 2015-03-19 LAB — POCT PREGNANCY, URINE: Preg Test, Ur: NEGATIVE

## 2015-03-19 MED ORDER — MEDROXYPROGESTERONE ACETATE 150 MG/ML IM SUSP
150.0000 mg | Freq: Once | INTRAMUSCULAR | Status: AC
  Administered 2015-03-19: 150 mg via INTRAMUSCULAR

## 2015-06-04 ENCOUNTER — Ambulatory Visit (INDEPENDENT_AMBULATORY_CARE_PROVIDER_SITE_OTHER): Payer: Medicaid Other | Admitting: *Deleted

## 2015-06-04 DIAGNOSIS — Z3042 Encounter for surveillance of injectable contraceptive: Secondary | ICD-10-CM

## 2015-06-04 DIAGNOSIS — Z30013 Encounter for initial prescription of injectable contraceptive: Secondary | ICD-10-CM | POA: Diagnosis not present

## 2015-08-20 ENCOUNTER — Encounter: Payer: Self-pay | Admitting: *Deleted

## 2015-08-20 ENCOUNTER — Ambulatory Visit (INDEPENDENT_AMBULATORY_CARE_PROVIDER_SITE_OTHER): Payer: Medicaid Other | Admitting: *Deleted

## 2015-08-20 VITALS — BP 126/68 | HR 79 | Temp 98.2°F

## 2015-08-20 DIAGNOSIS — Z3042 Encounter for surveillance of injectable contraceptive: Secondary | ICD-10-CM

## 2015-08-20 DIAGNOSIS — Z30013 Encounter for initial prescription of injectable contraceptive: Secondary | ICD-10-CM

## 2015-08-28 ENCOUNTER — Encounter (HOSPITAL_COMMUNITY): Payer: Self-pay | Admitting: Emergency Medicine

## 2015-08-28 ENCOUNTER — Emergency Department (HOSPITAL_COMMUNITY)
Admission: EM | Admit: 2015-08-28 | Discharge: 2015-08-28 | Disposition: A | Discharge status: Home / Self Care | Payer: Medicaid Other | Attending: Emergency Medicine | Admitting: Emergency Medicine

## 2015-08-28 DIAGNOSIS — J449 Chronic obstructive pulmonary disease, unspecified: Secondary | ICD-10-CM | POA: Insufficient documentation

## 2015-08-28 DIAGNOSIS — Z720 Tobacco use: Secondary | ICD-10-CM | POA: Insufficient documentation

## 2015-08-28 DIAGNOSIS — L0201 Cutaneous abscess of face: Secondary | ICD-10-CM | POA: Diagnosis present

## 2015-08-28 DIAGNOSIS — L0291 Cutaneous abscess, unspecified: Secondary | ICD-10-CM

## 2015-08-28 DIAGNOSIS — Z79899 Other long term (current) drug therapy: Secondary | ICD-10-CM | POA: Insufficient documentation

## 2015-08-28 DIAGNOSIS — Z793 Long term (current) use of hormonal contraceptives: Secondary | ICD-10-CM | POA: Diagnosis not present

## 2015-08-28 MED ORDER — HYDROCODONE-ACETAMINOPHEN 5-325 MG PO TABS: 2.0000 | ORAL_TABLET | ORAL | Status: AC | PRN

## 2015-08-28 MED ORDER — LIDOCAINE HCL (PF) 1 % IJ SOLN
5.0000 mL | Freq: Once | INTRAMUSCULAR | Status: AC
  Administered 2015-08-28: 5 mL
  Filled 2015-08-28: qty 5

## 2015-08-28 NOTE — ED Provider Notes (Signed)
CSN: [id number]     Arrival date & time 08/28/15  1003 History   First MD Initiated Contact with Patient 08/28/15 1010     Chief Complaint  Patient presents with  . Abscess     (Consider location/radiation/quality/duration/timing/severity/associated sxs/prior Treatment) HPI  Paige Bailey is a 39 y.o F with no significant pmhx who presents for a bump behind her left ear. Pt states that she noticed a sore spot behind her left ear 4 days ago. The bump has gotten progressively larger and more painful. She has felt some drainage from the area, unable to visualize area due to location. Denies fever, inner ear pain, recent illness, jaw pain, headache, tinnitus.     Past Medical History  Diagnosis Date  . Adult ADHD   . COPD (chronic obstructive pulmonary disease) [facility](HCC)    Past Surgical History  Procedure Laterality Date  . Cesarean section    . Appendectomy    . Cesarean section N/A 10/17/2014    Procedure: CESAREAN SECTION;  Surgeon: Adam PhenixJames G Arnold, MD;  Location: [facility];  Service: Obstetrics;  Laterality: N/A;   Family History  Problem Relation Age of Onset  . Hyperlipidemia Father   . Heart disease Father   . Hypertension Father   . Stroke Father   . Heart disease Brother   . Hyperlipidemia Brother   . Hypertension Brother    Social History  Substance Use Topics  . Smoking status: Current Every Day Smoker -- 0.50 packs/day  . Smokeless tobacco: Never Used  . Alcohol Use: No   OB History    Gravida Para Term Preterm AB TAB SAB Ectopic Multiple Living   5 4 0 4 1 0 1 0 0 2       Obstetric Comments   10 weeks     Review of Systems  All other systems reviewed and are negative.     Allergies  Phenergan  Home Medications   Prior to Admission medications   Medication Sig Start Date End Date Taking? Authorizing Provider  amphetamine-dextroamphetamine (ADDERALL) 20 MG tablet Take 20 mg by mouth 3 (three) times daily.   Yes Historical Provider, MD   fluticasone-salmeterol (ADVAIR HFA) 115-21 MCG/ACT inhaler Inhale 1 puff into the lungs 2 (two) times daily.    Yes Historical Provider, MD  medroxyPROGESTERone (DEPO-PROVERA) 150 MG/ML injection Inject 150 mg into the muscle every 3 (three) months.   Yes Historical Provider, MD  albuterol (PROVENTIL HFA;VENTOLIN HFA) 108 (90 BASE) MCG/ACT inhaler Inhale 2 puffs into the lungs every 6 (six) hours as needed for wheezing or shortness of breath.     Historical Provider, MD  HYDROcodone-acetaminophen (NORCO/VICODIN) 5-325 MG tablet Take 2 tablets by mouth every 4 (four) hours as needed. 08/28/15   Faiga Stones Tripp Mavery Milling, PA-C   BP 122/65 mmHg  Pulse 64  Temp(Src) 98.7 F (37.1 C) (Oral)  Resp 18  SpO2 99% Physical Exam  Constitutional: She is oriented to person, place, and time. She appears well-developed and well-nourished. No distress.  HENT:  Head: Normocephalic and atraumatic.  Right Ear: Hearing, tympanic membrane, external ear and ear canal normal.  Left Ear: Hearing, tympanic membrane and ear canal normal.  Eyes: Conjunctivae and EOM are normal. Pupils are equal, round, and reactive to light. Right eye exhibits no discharge. Left eye exhibits no discharge. No scleral icterus.  Cardiovascular: Normal rate and intact distal pulses.   Pulmonary/Chest: Effort normal and breath sounds normal.  Abdominal: Soft.  Musculoskeletal: Normal range of motion. She exhibits  no edema.  Neurological: She is alert and oriented to person, place, and time.  Skin: Skin is warm and dry. No rash noted. She is not diaphoretic. No pallor.  1.5 cm fluctuant mass behind left ear. Minimal suppurative drainage. No active bleeding.   Psychiatric: She has a normal mood and affect. Her behavior is normal.  Nursing note and vitals reviewed.   ED Course  Procedures (including critical care time)  INCISION AND DRAINAGE Performed by: Dub Mikes Consent: Verbal consent obtained. Risks and benefits:  risks, benefits and alternatives were discussed Type: abscess  Body area: behind left ear  Anesthesia: local infiltration  Incision was made with a scalpel.  Local anesthetic: lidocaine 1% without epinephrine  Anesthetic total: 2 ml  Complexity: complex Blunt dissection to break up loculations  Drainage: purulent  Drainage amount: copious   Patient tolerance: Patient tolerated the procedure well with no immediate complications.    Labs Review Labs Reviewed - No data to display  Imaging Review No results found. I have personally reviewed and evaluated these images and lab results as part of my medical decision-making.   EKG Interpretation None      MDM   Final diagnoses:  Abscess   Otherwise healthy 39 year old female presents with 1.5 cm abscess behind her left ear onset 4 days ago. No fever, chills, jaw pain, tinnitus. Vital signs stable. Abscess drained. Will DC home on pain medication for couple of days. Return precautions given which are outlined in patient discharge instructions.    Lester Kinsman Perkins, PA-C 08/28/15 1251  Mirian Mo, MD 08/29/15 1014

## 2015-08-28 NOTE — ED Notes (Signed)
Pt reports noticing a bump behind her left ear four days ago which has continued to grow in size. NAD at this time. Pt alert x4.

## 2015-08-28 NOTE — Discharge Instructions (Signed)
Abscess °An abscess is an infected area that contains a collection of pus and debris. It can occur in almost any part of the body. An abscess is also known as a furuncle or boil. °CAUSES  °An abscess occurs when tissue gets infected. This can occur from blockage of oil or sweat glands, infection of hair follicles, or a minor injury to the skin. As the body tries to fight the infection, pus collects in the area and creates pressure under the skin. This pressure causes pain. People with weakened immune systems have difficulty fighting infections and get certain abscesses more often.  °SYMPTOMS °Usually an abscess develops on the skin and becomes a painful mass that is red, warm, and tender. If the abscess forms under the skin, you may feel a moveable soft area under the skin. Some abscesses break open (rupture) on their own, but most will continue to get worse without care. The infection can spread deeper into the body and eventually into the bloodstream, causing you to feel ill.  °DIAGNOSIS  °Your caregiver will take your medical history and perform a physical exam. A sample of fluid may also be taken from the abscess to determine what is causing your infection. °TREATMENT  °Your caregiver may prescribe antibiotic medicines to fight the infection. However, taking antibiotics alone usually does not cure an abscess. Your caregiver may need to make a small cut (incision) in the abscess to drain the pus. In some cases, gauze is packed into the abscess to reduce pain and to continue draining the area. °HOME CARE INSTRUCTIONS  °· Only take over-the-counter or prescription medicines for pain, discomfort, or fever as directed by your caregiver. °· If you were prescribed antibiotics, take them as directed. Finish them even if you start to feel better. °· If gauze is used, follow your caregiver's directions for changing the gauze. °· To avoid spreading the infection: °· Keep your draining abscess covered with a  bandage. °· Wash your hands well. °· Do not share personal care items, towels, or whirlpools with others. °· Avoid skin contact with others. °· Keep your skin and clothes clean around the abscess. °· Keep all follow-up appointments as directed by your caregiver. °SEEK MEDICAL CARE IF:  °· You have increased pain, swelling, redness, fluid drainage, or bleeding. °· You have muscle aches, chills, or a general ill feeling. °· You have a fever. °MAKE SURE YOU:  °· Understand these instructions. °· Will watch your condition. °· Will get help right away if you are not doing well or get worse. °  °This information is not intended to replace advice given to you by your health care provider. Make sure you discuss any questions you have with your health care provider. °  °Document Released: 07/21/2005 Document Revised: 04/11/2012 Document Reviewed: 12/24/2011 °Elsevier Interactive Patient Education ©2016 [employer] ° °Incision and Drainage °Incision and drainage is a procedure in which a sac-like structure (cystic structure) is opened and drained. The area to be drained usually contains material such as pus, fluid, or blood.  °LET YOUR CAREGIVER KNOW ABOUT:  °· Allergies to medicine. °· Medicines taken, including vitamins, herbs, eyedrops, over-the-counter medicines, and creams. °· Use of steroids (by mouth or creams). °· Previous problems with anesthetics or numbing medicines. °· History of bleeding problems or blood clots. °· Previous surgery. °· Other health problems, including diabetes and kidney problems. °· Possibility of pregnancy, if this applies. °RISKS AND COMPLICATIONS °· Pain. °· Bleeding. °· Scarring. °· Infection. °BEFORE THE PROCEDURE  °  You may need to have an ultrasound or other imaging tests to see how large or deep your cystic structure is. Blood tests may also be used to determine if you have an infection or how severe the infection is. You may need to have a tetanus shot. PROCEDURE  The affected area  is cleaned with a cleaning fluid. The cyst area will then be numbed with a medicine (local anesthetic). A small incision will be made in the cystic structure. A syringe or catheter may be used to drain the contents of the cystic structure, or the contents may be squeezed out. The area will then be flushed with a cleansing solution. After cleansing the area, it is often gently packed with a gauze or another wound dressing. Once it is packed, it will be covered with gauze and tape or some other type of wound dressing. AFTER THE PROCEDURE   Often, you will be allowed to go home right after the procedure.  You may be given antibiotic medicine to prevent or heal an infection.  If the area was packed with gauze or some other wound dressing, you will likely need to come back in 1 to 2 days to get it removed.  The area should heal in about 14 days.   This information is not intended to replace advice given to you by your health care provider. Make sure you discuss any questions you have with your health care provider.   Keep area clean and dry. Return to the ED if you experience redness, swelling or drainage around affected area, or fever.

## 2015-11-10 ENCOUNTER — Ambulatory Visit: Payer: Medicaid Other

## 2015-11-10 IMAGING — CR DG FINGER LITTLE 2+V*R*
3 series · 3 of 3 positions shown · non-contrast
Comparison: None.

CLINICAL DATA: Shut in car door

EXAM:
RIGHT LITTLE FINGER 2+V

[x finger pa right]
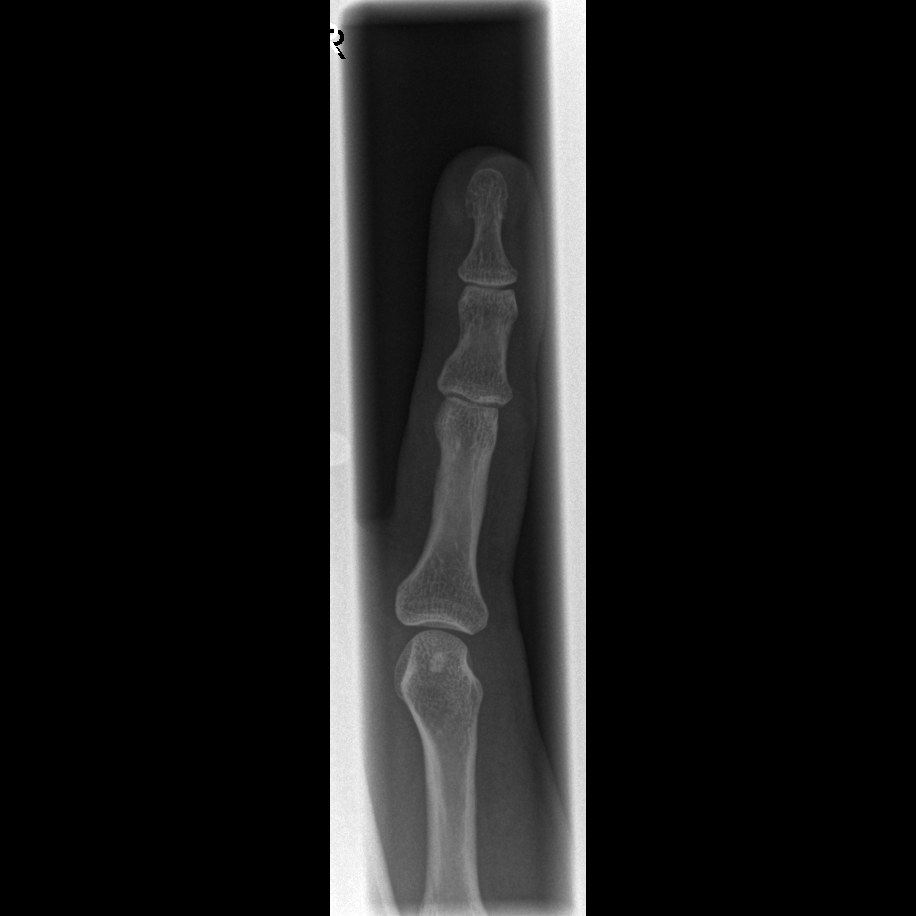

[x finger obl. right]
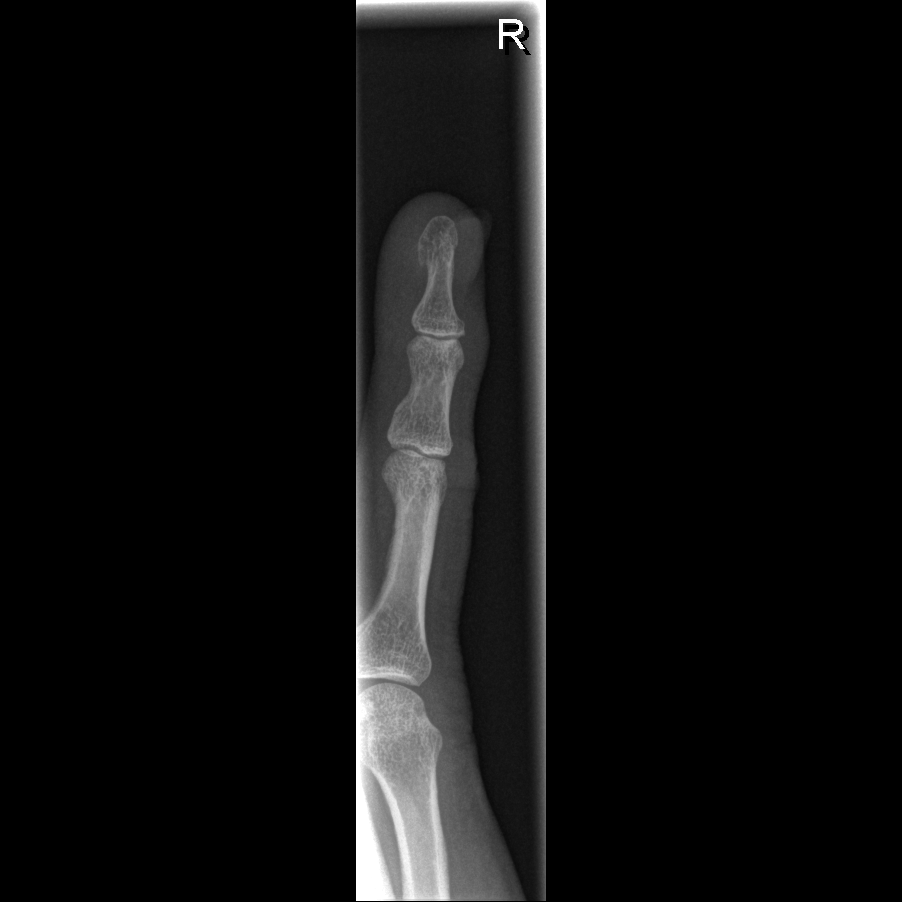

[x finger lateral right]
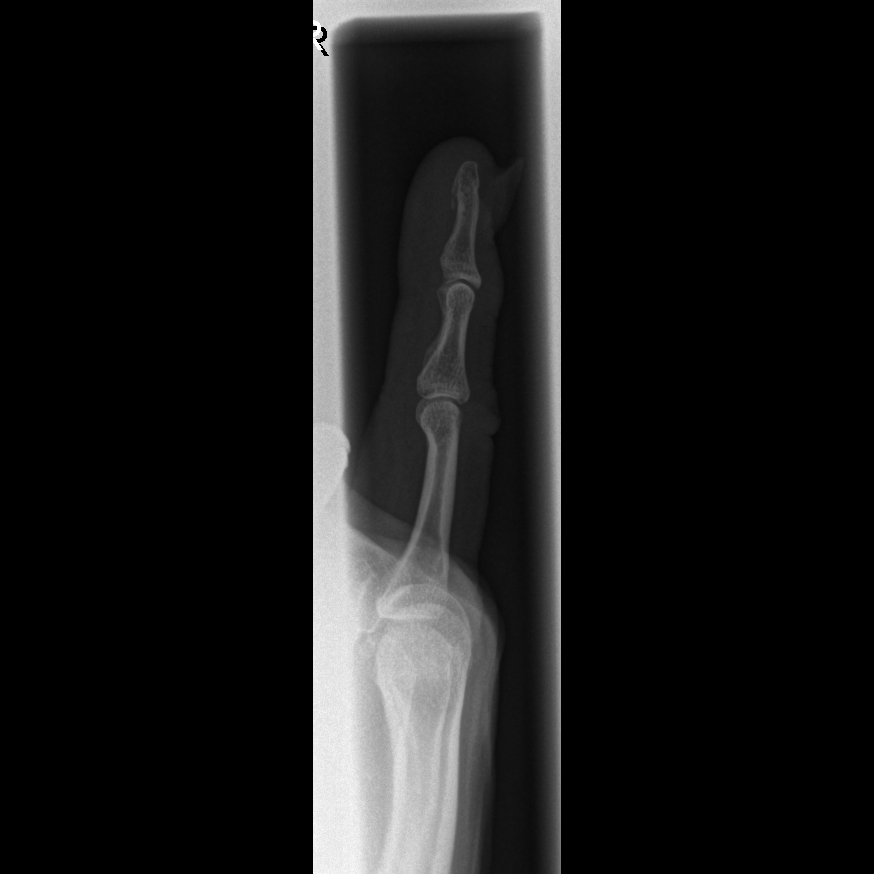

[3 of 3 positions shown; findings below may reference images not displayed]

FINDINGS: Soft tissue injury to the nail

Nondisplaced fracture of the tuft of the distal phalanx best seen on
the lateral view. The fracture is on the ventral surface.
IMPRESSION: Nondisplaced fracture of the ventral tuft of the distal phalanx.

## 2020-09-11 NOTE — ED Provider Notes (Signed)
Formatting of this note is different from the original.    [facility]    ED Provider Note    Tina Merritt 44 y.o. female DOB: Feb 06, 1976 MRN: [id number]  History     Chief Complaint   Patient presents with   ? Fall     2 days ago, tripped and fell onto a box. left sided pain.    ? Congestion     green nasal drainage. daughter was sick recently.      44 year old female with past medical history of asthma presents to emergency room with complaints of increased chest tightness and wheezing over the past several days, nasal congestion, sinus pressure and congestion for the past several days.  Patient denies any fever or chills, denies any shortness of breath.  Patient denies any chest pain or pressure.  Patient states she also injured her left upper quadrant area couple of days ago when she was carrying a box next to her torso tripped and fell landing on her left side against a box.  Patient states she vomited several times first day after the injury which was 2 days ago, denies any vomiting in the past 24 hours.  Patient states she has been eating okay, denies any dizziness or lightheadedness, denies any chest pain, denies any diarrhea or constipation.  Patient denies any rib cage pain or injury.  Patient states her daughter been sick at home also with upper respiratory tract infection symptoms and tested negative for Covid.  Patient is a smoker.    Past Medical History:   Diagnosis Date   ? COPD (chronic obstructive pulmonary disease) (*)    ? Substance abuse (*)      Past Surgical History:   Procedure Laterality Date   ? Appendectomy     ? Cesarean section      x's 2      Social History     Substance and Sexual Activity   Alcohol Use No     Social History     Tobacco Use   Smoking Status Current Every Day Smoker   ? Packs/day: 0.50   ? Types: Cigarettes   Smokeless Tobacco Never Used     E-Cigarettes   ? Vaping Use Never User    ? Start Date     ? Cartridges/Day     ? Quit Date        Social History     Substance and Sexual Activity   Drug Use Not Currently           Allergies   Allergen Reactions   ? Phenergan [Benzyl Alc-Promethazine] Other     "makes me feel like I'm running and I'm not getting anywhere"     Discharge Medication List as of 09/12/2020 12:25 AM     CONTINUE these medications which have NOT CHANGED    Details   cloNIDine (CATAPRES) 0.2 mg tablet take 1 tablet by mouth twice a day, Normal     cyclobenzaprine (FLEXERIL) 10 mg tablet Take 10 mg by mouth 3 (three) times a day as needed for Muscle spasms., Until Discontinued, Historical Med     dicyclomine (BENTYL) 10 mg capsule Take 10 mg by mouth 30 (thirty) minutes before meals & at bedtime., Until Discontinued, Historical Med     fluticasone-salmeterol (ADVAIR HFA) 230-21 MCG/ACT inhaler Inhale 2 puffs into the lungs 2 (two) times daily., Until Discontinued, Historical Med     ibuprofen (ADVIL,MOTRIN) 800 mg tablet Take one  tablet (800 mg total) by mouth every 8 (eight) hours as needed for Pain., Starting Fri 04/25/2020, Print     medroxyPROGESTERone (DEPO-PROVERA) 150 mg/mL injection use as directed at physician's office, Historical Med     naproxen (NAPROSYN) 500 mg tablet Take one tablet (500 mg dose) by mouth 2 (two) times daily with meals., Starting Sat 12/29/2019, Print     amphetamine-dextroamphetamine (ADDERALL XR) 10 mg 24 hr capsule Take 10 mg by mouth every morning., Until Discontinued, Historical Med     buprenorphine-naloxone (SUBOXONE) 8-2 MG FILM SL film Use 1.5 films for 4 days, 1 film for 4 days, 0.5 films for 4 days, Historical Med         Review of Systems     Review of Systems   Constitutional: Positive for appetite change and fatigue. Negative for chills and fever.   HENT: Positive for congestion, postnasal drip, rhinorrhea, sinus pressure, sinus pain and sore throat.    Respiratory: Positive for cough, chest tightness and wheezing. Negative for shortness of breath.    Cardiovascular: Negative for chest pain,  palpitations and leg swelling.   Gastrointestinal: Positive for abdominal pain. Negative for blood in stool, constipation, diarrhea, nausea and vomiting.   Neurological: Negative for dizziness and light-headedness.   All other systems reviewed and are negative.    Physical Exam     ED Triage Vitals [09/11/20 2014]   BP (!) 132/57   Heart Rate 97   Resp 20   SpO2 98 %   Temp 98.6 F (37 C)     Physical Exam   Nursing note and vitals reviewed.  Constitutional: She appears well-developed and well-nourished.   Nontoxic-appearing, talkative, in no apparent distress patient.   HENT:   Head: Normocephalic and atraumatic.   Right Ear: Normal external ear and normal canal.   Left Ear: Normal canal and normal external ear.   Nose: Mucosal edema. Right sinus tenderness present. Right maxillary and ethmoid sinus tenderness.Left sinus tenderness present. Left maxillary and ethmoid sinus tenderness.   Mouth/Throat: Mucous membranes are normal. Posterior oropharyngeal erythema. No oropharyngeal exudate, posterior oropharyngeal edema and peritonsillar abscesses.Uvula is midline. No uvular swelling and erythema. No hoarse voice and muffled voice. No foreign body present. No tonsillar exudate. Voice normal.   Eyes: EOM are intact. Conjunctivae are normal. Pupils are equal, round, and reactive to light.   Neck: Normal range of motion and voice normal. Neck supple.   Cardiovascular: Normal rate, regular rhythm, normal heart sounds and intact distal pulses.   Pulmonary/Chest: Respiratory effort normal and breath sounds normal. No visible chest trauma.   Abdominal: Soft. There is moderate abdominal tenderness in the left upper quadrant. There is no guarding and no rebound. Abdomen not distended. Bowel sounds are normal. hernia is not present. There is no palpable pulsatile mass. There is no CVA tenderness. No visible abdominal trauma.   Musculoskeletal: Normal range of motion. No visible trauma noted on back exam.      Cervical back:  Normal range of motion and neck supple.     Neurological: She is alert. Moves all extremities equally. She has normal speech.   Skin: Skin is warm. Skin is dry.   Psychiatric: She has a normal mood and affect. Her behavior is normal. Judgment and thought content normal.     ED Course     Lab results:    COVID-19, FLU A+B AND RSV - Abnormal       Result Value    Flu A  Negative      Comment: Notes^^[id number]    Flu B Negative      Comment: Notes^^[id number]    RSV PCR Negative      Comment: Notes^^[id number]    SARS-COV-2 Detected (*)     Comment: [id number]^^[id number]    Narrative:     Flu and/or RSV - Negative results do not preclude the presence of Flu or RSV virus and should not be used as the sole basis for treatment or other patient management decisions. False negative results may occur if virus is present at levels below the analytical limit of detection.    Positive results should be interpreted with other laboratory and clinical data available to the clinician.  A positive result may occur in the absence of viable virus.    This test detects Influenza A, Influenza B, and Respiratory Syncytial Virus and SARS-COV-2 (COVID-19) by PCR.    Testing was performed using the CEPHEID SARS-CoV-2 EUA ASSAY:   The Cepheid SARS-CoV-2 EUA assay has not been FDA cleared or approved.  It has been authorized by FDA under an Emergency Use Authorization (EUA).  The test has been authorized only for the detection of nucleic acid from SARS-CoV-2, not for any other viruses or pathogens.  It is only authorized for the duration of time the declaration that circumstances exist justifying the authorization of the emergency use of in vitro diagnostic tests for detection of SARS-CoV-2 virus and/or diagnosis of COVID-19 infection under section 564(b) (1) of the Act, 21 U.S.C ([phone] (b) (1), unless the authorization is terminated or revoked sooner.     Link to Patient Fact Sheet:   https://www.moore.com/     Link to Provider Fact Sheet    https://www.young.biz/      CBC AND DIFFERENTIAL - Abnormal    WBC 5.8      RBC 4.46      HGB 13.2      HCT 39.9      MCV 90      MCH 29.6      MCHC 33.1      Plt Ct 226      RDW SD 40.7      MPV 9.9      NRBC% 0.0      NRBC 0.000      NEUTROPHIL % 40.4 (*)     LYMPHOCYTE % 50.0 (*)     MONOCYTE % 8.4      Eosinophil % 0.3 (*)     BASOPHIL % 0.2      IG% 0.700 (*)     ABSOLUTE NEUTROPHIL COUNT 2.36      ABSOLUTE LYMPHOCYTE COUNT 2.9      MONO ABSOLUTE 0.5      EOS ABSOLUTE 0.0      BASO ABSOLUTE 0.0      IG ABSOLUTE 0.040 (*)    BASIC METABOLIC PANEL - Abnormal    Na 137      Potassium 3.6 (*)     Cl 101      CO2 28      AGAP 8      Glucose 98      BUN 19      Creatinine 0.55 (*)     Ca 9.0      BUN/CREAT RATIO 34.5 (*)     GFR AFRICAN AMERICAN 133      Comment: African-American:   Normal GFR (glomerular filtration rate) > 60 mL/min/1.73 meters squared. < 60 may include impaired kidney function based  on creatinine, age, legal sex, and race normalized to accepted average body surface area     GFR Non African American 115      Comment: Non African-American:   Normal GFR (glomerular filtration rate) > 60 mL/min/1.73 meters squared. < 60 may include impaired kidney function based on creatinine, age, legal sex, and race normalized to accepted average body surface area    UA NEGATIVE POPULATION, POS SYMPTOMS - Abnormal    Urine Color Yellow      Urine Appearance Cloudy (*)     Urine Specific Gravity 1.024      Urine pH 7.0      Urine Protein - Dipstick 10  (*)     Urine Glucose Negative      Urine Ketones Negative      Urine Bilirubin Negative      Urine Blood Negative      Urine Nitrite Negative      Urine Urobilinogen <2       Urine Leukocyte Esterase Negative      Urine Squamous Epithelial Cells 3-4 (*)     Urine WBC 3-5 (*)     Urine RBC 0-2      Urine Bacteria 3+  (*)     UA Microscopic Yes Micro (*)     Narrative:     Does not meet criteria for reflex to Urine Culture.   LIPASE - Normal     Lipase 21     HCG, URINE, QUALITATIVE - Normal    U BETA HCG QUAL Negative     HEPATIC FUNCTION PANEL    Alb 4.0      ALK PHOS 95      AST 22      ALT 21      T Bili 0.21      D Bili <0.20      Bili Ind        Total Protein 7.0      Narrative:     Direct Bilirubin less than linearity, Indirect Bilirubin not calculated.       Imaging:    XR CHEST PA AND LATERAL    Narrative:     PA and lateral views of the chest.    HISTORY: Cough.    The lungs are hyperinflated.    There is no acute infiltrate.    There is no pleural effusion.    There is a skinfold along the lateral aspect of the right hemithorax    The cardiac silhouette is within normal limits.    The visualized osseous structures are unremarkable.     Impression:     IMPRESSION:    No acute pulmonary disease.    Electronically Signed by: Azzie Almas   CT ABDOMEN PELVIS W IV CONTRAST    Narrative:     INDICATION: Left upper quadrant injury/pain. COVID positive.  COMPARISON:  12/25/2016.    TECHNIQUE:  CT ABDOMEN PELVIS W IV CONTRAST - Contrast: 64 mL  IOPAMIDOL 76 % IV SOLN. Dose reduction was utilized (automated exposure control, mA or kV adjustment based on patient size, or iterative image reconstruction).    FINDINGS:   #  Lower Thorax: Lung bases are clear.  #  Liver: Unremarkable.  #  Gallbladder: Normal gallbladder.  #  Pancreas: Normal.  #  Spleen: Normal.  #  Adrenal Glands: Normal.  #  Kidneys: No renal stones or hydronephrosis bilaterally.  #  Stomach:Unremarkable.  #  Bowel: Nonobstructive bowel gas pattern. No  bowel wall inflammation.  #  Appendix: Appendectomy.   #  Peritoneal Cavity: No pneumoperitoneum.  No lymphadenopathy.   #  Urinary Bladder: Unremarkable.  #  Pelvis: No free pelvic fluid.  #  Bones: No acute fractures or dislocations. No osseous destructive lesions.   #      Impression:     IMPRESSION:  1.  No acute findings in abdomen or pelvis.    Electronically Signed by: Nunzio Cory     ECG:  ECG Results     None                          Pre-Sedation  Procedures      MDM  Number of Diagnoses or Management Options  Diagnosis management comments: DD: Acute URI.  Rule out Covid.  Left upper abdominal injury, rule out spleen rupture or hematoma.    Patient tested positive for Covid, blood work does not show any signs of acute anemia or acute inflammatory or infectious process.  Patient was given albuterol nebulizer treatment in here, declined IV Solu-Medrol.  Patient was given IV fluids in here, still pending for abdominal CT scan with IV contrast.  Chest x-ray does not show any signs of acute consolidative process.  Patient will be discharged home with albuterol inhaler, p.o. antibiotics and steroids pending normal abdominal CT scan.  Patient CARE reassigned to attending Dr. Chaney Malling.      Amount and/or Complexity of Data Reviewed  Clinical lab tests: ordered and reviewed  Tests in the radiology section of CPT: ordered and reviewed  Tests in the medicine section of CPT: ordered and reviewed    Risk of Complications, Morbidity, and/or Mortality  Presenting problems: moderate  Diagnostic procedures: moderate  Management options: moderate    Patient Progress  Patient progress: stable    Coding    Provider Communication    Discharge Medication List as of 09/12/2020 12:25 AM     START taking these medications    Details   albuterol sulfate HFA (PROVENTIL,VENTOLIN,PROAIR) 108 (90 Base) MCG/ACT inhaler Inhale two puffs into the lungs every 4 (four) hours as needed for Wheezing., Starting Thu 09/11/2020, Print     azithromycin (ZITHROMAX Z-PAK) 250 mg tablet Take one tablet (250 mg dose) by mouth daily for 4 days., Starting Thu 09/11/2020, Until Mon 09/15/2020, Print     predniSONE (DELTASONE) 10 mg tablet 4 tabs daily x2 days, 3 tabs daily x2 days, 2 tabs daily x2 days, 1 tablet daily x2 days, Print         Discharge Medication List as of 09/12/2020 12:25 AM       Discharge Medication List as of 09/12/2020 12:25 AM     STOP  taking these medications      ALBUTEROL SULFATE IN Comments:   Reason for Stopping:          Clinical Impression     Final diagnoses:   Moderate persistent asthma with exacerbation   COVID-19   Contusion of abdominal wall, initial encounter     ED Disposition     ED Disposition Comment    Discharge                 Follow-up Information     [facility] In 3 days.    Specialty: Emergency Medicine  Comments: If symptoms worsen  Contact information:  [address]  [place] [address]  [phone]  Electronically signed by:    Tera Partridge, PA-C  09/12/20 [id number]    Electronically signed by Lajean Silvius, MD at 09/14/2020  6:26 PM EST    Associated attestation - Lajean Silvius, MD - 09/14/2020  6:26 PM EST  Formatting of this note might be different from the original.  I was immediately available to the APP for questions as needed. The APP did not consult me on this patient. PT was signed out to me to follow up results of CT in the setting of her recent trauma. Imaging was negative. Lungs were clear on reassessment. PT in NAD. I do not seen an indication for narcotic analgesia at this time. PT in agreement with treatment plan.     Based on the documentation reviewed, the care provided is reasonable.

## 2022-09-12 DIAGNOSIS — K047 Periapical abscess without sinus: Secondary | ICD-10-CM

## 2022-09-12 NOTE — ED Triage Notes (Signed)
Patient reports left lower dental pain that started last night. Patient has tried motrin without relief. Patient states that the pain is now going down into her neck. Last dose of motrin was at 5 this afternoon.

## 2022-09-12 NOTE — ED Notes (Signed)
I have reviewed discharge instructions with the patient.  The patient verbalized understanding.       Alease Frame, RN  09/12/22 [phone]

## 2022-09-12 NOTE — ED Provider Notes (Signed)
EMERGENCY DEPARTMENT HISTORY AND PHYSICAL EXAM      Patient Name: Tina Merritt  MRN: [id number]  Birth Date: 1976-05-18  Provider: Milus Glazier, DO  PCP: No primary care provider on file.   Time/Date of evaluation: 8:32 PM EST on 09/12/22    History of Presenting Illness     Chief Complaint   Patient presents with    Dental Pain       History Provided By: Patient     History Mahalia Longest):   Shermika Latausha Flamm is a 46 y.o. female with a PMHX of ADHD and prior history of substance abuse (opioid)  who presents to the emergency department  by POV C/O left lower dental pain onset yesterday.  Patient states that her left lower jaw started hurting yesterday.  Patient states that is began to swell.  Patient states that she has been taking 800 mg of ibuprofen every 4 hours without pain relief.  Patient denies any difficulty swallowing.  Patient states that she has had a low-grade fever.  Patient denies any chills.  Patient denies any chest pain or shortness of breath.  Patient states that she has not been able to contact her dentist as of yet as the office was closed yesterday and today.    Past History     Past Medical History:  History reviewed. No pertinent past medical history.    Past Surgical History:  Past Surgical History:   Procedure Laterality Date    APPENDECTOMY      CESAREAN SECTION         Family History:  History reviewed. No pertinent family history.    Social History:  Social History     Tobacco Use    Smoking status: Every Day     Packs/day: .5     Types: Cigarettes    Smokeless tobacco: Never       Medications:  Current Facility-Administered Medications   Medication Dose Route Frequency Provider Last Rate Last Admin    penicillin v potassium (VEETID) tablet 500 mg  500 mg Oral NOW Durene Cal, DO         Current Outpatient Medications   Medication Sig Dispense Refill    amphetamine-dextroamphetamine (ADDERALL) 20 MG tablet Take 1 tablet by mouth 3 times daily. Max Daily Amount: 60 mg       QUEtiapine (SEROQUEL XR) 50 MG extended release tablet Take 1 tablet by mouth nightly as needed      penicillin v potassium (VEETID) 500 MG tablet Take 1 tablet by mouth 4 times daily for 10 days 40 tablet 0       Allergies:  Allergies   Allergen Reactions    Promethazine Other (See Comments)     Other reaction(s): other/intolerance  Restless legs  Restless legs  Legs shaking          Social Determinants of Health:  Social Determinants of Health     Tobacco Use: High Risk (09/12/2022)    Patient History     Smoking Tobacco Use: Every Day     Smokeless Tobacco Use: Never     Passive Exposure: Not on file   Alcohol Use: Not At Risk (09/12/2022)    AUDIT-C     Frequency of Alcohol Consumption: Never     Average Number of Drinks: Patient does not drink     Frequency of Binge Drinking: Never   Financial Resource Strain: Not on file   Food Insecurity: Not on file   Transportation Needs:  Not on file   Physical Activity: Not on file   Stress: Not on file   Social Connections: Not on file   Intimate Partner Violence: Not on file   Depression: Not on file   Housing Stability: Not on file   Interpersonal Safety: Not on file   Utilities: Not on file       Review of Systems   Review of Systems    Negative except as listed above in HPI.    Physical Exam     Vitals:    09/12/22 1943   BP: 137/71   Pulse: 90   Resp: 17   Temp: 98.1 F (36.7 C)   TempSrc: Oral   SpO2: 99%       Physical Exam      Constitutional: Non-toxic appearing, no distress  HEENT: Normocephalic, Atraumatic; PERRL; Mouth some dental caries noted.  On the left lower jaw there is an abscess on the medial aspect of the neck to last tooth.  Neck: Supple  Cardiovascular: Regular rate and rhythm, no murmurs, rubs, or gallops  Chest: Normal work of breathing and chest excursion bilaterally  Lungs: Faint wheezes expiratory early in middle and lower lung fields bilaterally  Extremities: No evidence of trauma or deformity, no LE edema  Skin: Warm and dry, normal cap  refill  Neuro: Alert and appropriate, CN intact, normal speech, strength and sensation full and symmetric bilaterally, normal coordination  Psychiatric: Normal mood and affect     Diagnostic Study Results     Labs:  No results found for this or any previous visit (from the past 12 hour(s)).    Radiologic Studies:   No orders to display       No results found.  Procedures     Incision/Drainage    Date/Time: 09/12/2022 8:10 PM    Performed by: Durene Cal, DO  Authorized by: Durene Cal, DO    Consent:     Consent obtained:  Verbal    Consent given by:  Patient    Risks, benefits, and alternatives were discussed: yes      Risks discussed:  Bleeding and incomplete drainage    Alternatives discussed:  No treatment and delayed treatment  Universal protocol:     Procedure explained and questions answered to patient or proxy's satisfaction: yes      Required blood products, implants, devices, and special equipment available: yes      Site/side marked: yes      Immediately prior to procedure, a time out was called: yes      Patient identity confirmed:  Verbally with patient, hospital-assigned identification number and arm band  Location:     Type:  Abscess    Location:  Mouth    Mouth location:  Alveolar process  Sedation:     Sedation type:  None  Anesthesia:     Anesthesia method:  Nerve block    Block location:  Infra-areolar    Block needle gauge:  27 G    Block anesthetic:  Bupivacaine 0.5% WITH epi    Block technique:  For areolar    Block injection procedure:  Anatomic landmarks identified, introduced needle, negative aspiration for blood, incremental injection and anatomic landmarks palpated    Block outcome:  Anesthesia achieved  Procedure type:     Complexity:  Simple  Procedure details:     Ultrasound guidance: no      Needle aspiration: no      Incision types:  Stab incision    Incision depth:  Submucosal    Wound management:  Irrigated with saline (Milked by patient with tongue)    Drainage:   Purulent (With scant blood)    Drainage amount:  Moderate    Wound treatment:  Wound left open    Packing materials:  None  Post-procedure details:     Procedure completion:  Tolerated well, no immediate complications      ED Course     8:32 PM EST I Milus Glazier, DO) am the first provider for this patient. Initial assessment performed. I reviewed the vital signs, available nursing notes, past medical history, past surgical history, family history and social history. The patients presenting problems have been discussed, and they are in agreement with the care plan formulated and outlined with them.  I have encouraged them to ask questions as they arise throughout their visit.    Records Reviewed: Nursing Notes    Is this patient to be included in the SEP-1 core measure? No   Exclusion criteria - the patient is NOT to be included for SEP-1 Core Measure due to:  2+ SIRS criteria are not met    MEDICATIONS ADMINISTERED IN THE ED:  Medications   penicillin v potassium (VEETID) tablet 500 mg (has no administration in time range)        Medical Decision Making     CC/HPI Summary, DDx, ED Course, and Reassessment: This is a 46 year old female comes the emergency room with chief complaint of left lower dental pain.  Differential diagnosis includes dental caries, dental abscess, dental fracture, jaw fracture, parotitis.  Discussed with patient that she does have a dental abscess.  Patient agreeable to dental block and I&D of the abscess.  Please see procedure note for this.  Patient tolerated the I&D well.  Patient placed on penicillin and instructed to follow-up with dentist as soon as possible.    Disposition Considerations (tests considered but not done, Admit vs D/C, Shared Decision Making, Pt Expectation of Test or Tx.): Considered IV antibiotics, however, the absorption of antibiotics is about equivocal IV versus p.o.      Critical Care Time:     None    Milus Glazier, DO    Diagnosis and Disposition      DIAGNOSIS:   1. Dental abscess    2. Encounter for incision and drainage procedure        DISPOSITION Decision To Discharge 09/12/2022 08:39:35 PM      PATIENT REFERRED TO:  Your dentist    Schedule an appointment as soon as possible for a visit       [facility] EMERGENCY DEPT  [place]  [phone]    If symptoms worsen      DISCHARGE MEDICATIONS:  New Prescriptions    PENICILLIN V POTASSIUM (VEETID) 500 MG TABLET    Take 1 tablet by mouth 4 times daily for 10 days       DISCONTINUED MEDICATIONS:  Discontinued Medications    No medications on file        (Please note that portions of this note were completed with a voice recognition program.  Efforts were made to edit the dictations but occasionally words are mis-transcribed.)    Milus Glazier, DO (electronically signed)    I, Milus Glazier, DO, am the primary clinician of record.    Dragon Disclaimer     Please note that this dictation was completed with Dragon, the computer voice recognition  software.  Quite often unanticipated grammatical, syntax, homophones, and other interpretive errors are inadvertently transcribed by the computer software.  Please disregard these errors.  Please excuse any errors that have escaped final proofreading.    Milus Glazier, DO  (Electronically signed)           Durene Cal, DO  09/12/22 2046

## 2022-09-12 NOTE — Discharge Instructions (Signed)
You had an abscess of your tooth.  You had a dental block of your left lower jaw.  The abscess was then drained.  You are placed on penicillin.  Please finish course of antibiotics.  You may take Tylenol 1000 mg every 6-8 hours for pain.  You may take ibuprofen 800 mg every 8 hours for pain.  Call your dentist tomorrow for follow-up.  Return the emergency room for worsening symptoms or any other concerns.

## 2022-09-13 ENCOUNTER — Inpatient Hospital Stay
Admit: 2022-09-13 | Discharge: 2022-09-13 | Disposition: A | Discharge status: Home / Self Care | Payer: PRIVATE HEALTH INSURANCE | Attending: Emergency Medicine

## 2022-09-13 MED ORDER — PENICILLIN V POTASSIUM 250 MG PO TABS
250 MG | ORAL | Status: AC
Start: 2022-09-13 — End: 2022-09-12
  Administered 2022-09-13: 02:00:00 500 mg via ORAL

## 2022-09-13 MED ORDER — PENICILLIN V POTASSIUM 500 MG PO TABS
500 MG | ORAL_TABLET | Freq: Four times a day (QID) | ORAL | 0 refills | Status: AC
Start: 2022-09-13 — End: 2022-09-22

## 2022-09-13 MED FILL — PENICILLIN V POTASSIUM 250 MG PO TABS: 250 MG | ORAL | Qty: 2

## 2023-03-03 ENCOUNTER — Inpatient Hospital Stay
Admit: 2023-03-03 | Discharge: 2023-03-03 | Disposition: A | Discharge status: Home / Self Care | Payer: PRIVATE HEALTH INSURANCE | Attending: Emergency Medicine

## 2023-03-03 DIAGNOSIS — R600 Localized edema: Secondary | ICD-10-CM

## 2023-03-03 MED ORDER — AMOXICILLIN-POT CLAVULANATE 875-125 MG PO TABS
875-125 | ORAL_TABLET | Freq: Two times a day (BID) | ORAL | 0 refills | Status: AC
Start: 2023-03-03 — End: 2023-03-10

## 2023-03-03 NOTE — ED Provider Notes (Shared)
[facility] EMERGENCY DEPT  EMERGENCY DEPARTMENT HISTORY AND PHYSICAL EXAM      Date: 03/03/2023  Patient Name: Tina Merritt  MRN: [id number]  Birthdate August 23, 1976  Date of evaluation: 03/03/2023  Provider: Aggie Cosier, MD   Note Started: 3:11 PM EDT 03/03/23    HISTORY OF PRESENT ILLNESS     Chief Complaint   Patient presents with    Leg Swelling     Bilateral         History Provided By: Patient    HPI: Tina Merritt is a 47 y.o. female states tripped yesterday around 1000 while pushing husband in a wheelchair. Larey Seat forward and noted a loose lower cuspid on left.     States came in to be evaluated after the fall. Notes some swelling to both ankles. Since 6pm yesterday. Denies other pain, swelling or injury.         PAST MEDICAL HISTORY   Past Medical History:  History reviewed. No pertinent past medical history.    Past Surgical History:  Past Surgical History:   Procedure Laterality Date    APPENDECTOMY      CESAREAN SECTION         Family History:  History reviewed. No pertinent family history.    Social History:  Social History     Tobacco Use    Smoking status: Every Day     Current packs/day: 0.50     Types: Cigarettes    Smokeless tobacco: Never       Allergies:  Allergies   Allergen Reactions    Promethazine Other (See Comments)     Other reaction(s): other/intolerance  Restless legs  Restless legs  Legs shaking          PCP: None, None    Current Meds:   No current facility-administered medications for this encounter.     Current Outpatient Medications   Medication Sig Dispense Refill    amphetamine-dextroamphetamine (ADDERALL) 20 MG tablet Take 1 tablet by mouth 3 times daily. Max Daily Amount: 60 mg      QUEtiapine (SEROQUEL XR) 50 MG extended release tablet Take 1 tablet by mouth nightly as needed         Social Determinants of Health:   Social Determinants of Health     Tobacco Use: High Risk (03/03/2023)    Patient History     Smoking Tobacco Use: Every Day     Smokeless Tobacco Use: Never     Passive  Exposure: Not on file   Alcohol Use: Not At Risk (09/12/2022)    AUDIT-C     Frequency of Alcohol Consumption: Never     Average Number of Drinks: Patient does not drink     Frequency of Binge Drinking: Never   Physicist, medical Strain: Not on file   Food Insecurity: Not on file   Transportation Needs: Not on file   Physical Activity: Not on file   Stress: Not on file   Social Connections: Not on file   Intimate Partner Violence: Not on file   Depression: Not on file   Housing Stability: Not on file   Interpersonal Safety: Not At Risk (03/03/2023)    Interpersonal Safety Domain Source: IP Abuse Screening     Physical abuse: Denies     Verbal abuse: Denies     Emotional abuse: Denies     Financial abuse: Denies     Sexual abuse: Denies   Utilities: Not on file  PHYSICAL EXAM   Physical Exam  ***    SCREENINGS                   LAB, EKG AND DIAGNOSTIC RESULTS   Labs:  No results found for this or any previous visit (from the past 12 hour(s)).    EKG:.{EKG smartlist:58350}    Radiologic Studies:  Non-plain film images such as CT, Ultrasound and MRI are read by the radiologist. Plain radiographic images are visualized and preliminarily interpreted by the ED Provider with the following findings: {Wet Read interpretation:58353}    Interpretation per the Radiologist below, if available at the time of this note:  No orders to display        ED COURSE and DIFFERENTIAL DIAGNOSIS/MDM   3:11 PM Differential and Considerations: ***    Records Reviewed (source and summary of external notes): Prior medical records and Nursing notes.    Vitals:    Vitals:    03/03/23 1433   BP: (!) 152/71   Pulse: 99   Resp: 18   Temp: 98.2 F (36.8 C)   TempSrc: Oral   SpO2: 100%   Weight: 63.5 kg (140 lb)   Height: 1.753 m (5\' 9" )        ED COURSE       SEPSIS Reassessment: {Sepsis reassessment smartlist:66672}    Clinical Management Tools:  {CMT List:60667::"Not Applicable"}    Patient was given the following medications:  Medications - No  data to display    CONSULTS: See ED Course/MDM for further details.  None     Social Determinants affecting Diagnosis/Treatment: {Social Determinants:58355}    Smoking Cessation: {smoking cessation smartlist:58351}    PROCEDURES   Unless otherwise noted above, none  Procedures      CRITICAL CARE TIME   {critical care smartlist:58349}    ED IMPRESSION   No diagnosis found.      DISPOSITION/PLAN   DISPOSITION      {ED Dispositions:58354}     PATIENT REFERRED TO:  No follow-up provider specified.      DISCHARGE MEDICATIONS:     Medication List        ASK your doctor about these medications      amphetamine-dextroamphetamine 20 MG tablet  Commonly known as: ADDERALL     QUEtiapine 50 MG extended release tablet  Commonly known as: SEROQUEL XR                DISCONTINUED MEDICATIONS:  Current Discharge Medication List          I am the Primary Clinician of Record. Aggie Cosier, MD (electronically signed)    (Please note that parts of this dictation were completed with voice recognition software. Quite often unanticipated grammatical, syntax, homophones, and other interpretive errors are inadvertently transcribed by the computer software. Please disregards these errors. Please excuse any errors that have escaped final proofreading.)

## 2023-03-03 NOTE — ED Triage Notes (Signed)
Pt states she tripped pushing her husband in a wheelchair yesterday, pt states she knocked a tooth loose, states she has a dentist appt for that    pt became concerned when her ankles both started to swell  pt denies any pain in her ankles

## 2023-03-05 ENCOUNTER — Inpatient Hospital Stay
Admit: 2023-03-05 | Discharge: 2023-03-05 | Disposition: A | Discharge status: Home / Self Care | Payer: PRIVATE HEALTH INSURANCE

## 2023-03-05 DIAGNOSIS — S0502XA Injury of conjunctiva and corneal abrasion without foreign body, left eye, initial encounter: Secondary | ICD-10-CM

## 2023-03-05 MED ORDER — TETRACAINE HCL 0.5 % OP SOLN
0.5 | Freq: Once | OPHTHALMIC | Status: AC
Start: 2023-03-05 — End: 2023-03-05
  Administered 2023-03-05: 22:00:00 2 [drp] via OPHTHALMIC

## 2023-03-05 MED ORDER — MOXIFLOXACIN HCL 0.5 % OP SOLN
0.5 | Freq: Three times a day (TID) | OPHTHALMIC | 0 refills | Status: AC
Start: 2023-03-05 — End: 2023-03-12

## 2023-03-05 MED ORDER — FLUORESCEIN SODIUM 1 MG OP STRP
1 | OPHTHALMIC | Status: AC
Start: 2023-03-05 — End: 2023-03-05
  Administered 2023-03-05: 22:00:00 1 via OPHTHALMIC

## 2023-03-05 MED FILL — TETRACAINE HCL 0.5 % OP SOLN: 0.5 % | OPHTHALMIC | Qty: 4

## 2023-03-05 MED FILL — FUL-GLO 1 MG OP STRP: 1 MG | OPHTHALMIC | Qty: 1

## 2023-03-05 NOTE — ED Provider Notes (Signed)
[facility] EMERGENCY DEPT  EMERGENCY DEPARTMENT ENCOUNTER      Pt Name: Tina Merritt  MRN: [id number]  Birthdate [birth year]  Date of evaluation: 03/05/2023  Provider: Rosanna Randy, MD  5:41 PM    CHIEF COMPLAINT       Chief Complaint   Patient presents with    Eye Problem         HISTORY OF PRESENT ILLNESS    Tina Merritt is a 47 y.o. female who presents to the emergency department for evaluation of left eye irritation after rubbing it upon awakening this afternoon.  Patient wears contact lenses daily.  No visual acuity changes.  No alleviating factors attempted.    The history is provided by the patient and medical records.       Nursing Notes were reviewed.    REVIEW OF SYSTEMS       Review of Systems   All other systems reviewed and are negative.      Except as noted above the remainder of the review of systems was reviewed and negative.       PAST MEDICAL HISTORY   History reviewed. No pertinent past medical history.      SURGICAL HISTORY       Past Surgical History:   Procedure Laterality Date    APPENDECTOMY      CESAREAN SECTION           CURRENT MEDICATIONS       Previous Medications    AMOXICILLIN-CLAVULANATE (AUGMENTIN) 875-125 MG PER TABLET    Take 1 tablet by mouth 2 times daily for 7 days    AMPHETAMINE-DEXTROAMPHETAMINE (ADDERALL) 20 MG TABLET    Take 1 tablet by mouth 3 times daily. Max Daily Amount: 60 mg    QUETIAPINE (SEROQUEL XR) 50 MG EXTENDED RELEASE TABLET    Take 1 tablet by mouth nightly as needed       ALLERGIES     Promethazine    FAMILY HISTORY     History reviewed. No pertinent family history.       SOCIAL HISTORY       Social History     Socioeconomic History    Marital status: Single     Spouse name: None    Number of children: None    Years of education: None    Highest education level: None   Tobacco Use    Smoking status: Every Day     Current packs/day: 0.50     Types: Cigarettes    Smokeless tobacco: Never       SCREENINGS         Glasgow Coma Scale  Eye Opening: Spontaneous  Best  Verbal Response: Oriented  Best Motor Response: Obeys commands  Glasgow Coma Scale Score: 15                     CIWA Assessment  BP: 129/67  Pulse: 87                 PHYSICAL EXAM       ED Triage Vitals [03/05/23 1728]   BP Temp Temp Source Pulse Respirations SpO2 Height Weight - Scale   129/67 98.3 F (36.8 C) Oral 87 18 98 % 1.753 m (5\' 9" ) 63.5 kg (140 lb)       Physical Exam  Vitals and nursing note reviewed.   Constitutional:       General: She is not in acute distress.  Appearance: Normal appearance. She is not ill-appearing, toxic-appearing or diaphoretic.   HENT:      Nose: Nose normal. No congestion or rhinorrhea.      Mouth/Throat:      Mouth: Mucous membranes are moist.      Pharynx: Oropharynx is clear. No oropharyngeal exudate or posterior oropharyngeal erythema.   Eyes:      General: No scleral icterus.        Right eye: No discharge.         Left eye: No discharge.      Extraocular Movements: Extraocular movements intact.      Pupils: Pupils are equal, round, and reactive to light.      Comments: No direct or indirect light pain, visual field cuts.  No uptake of fluorescein of the left eye.    Soft globe bilateral.    Mild left conjunctival injection sparing the limbus.  No hyphema or hypopyon.   Neck:      Vascular: No carotid bruit.   Cardiovascular:      Rate and Rhythm: Normal rate and regular rhythm.      Pulses: Normal pulses.      Heart sounds: Normal heart sounds. No murmur heard.     No friction rub. No gallop.   Pulmonary:      Effort: Pulmonary effort is normal. No respiratory distress.      Breath sounds: Normal breath sounds. No stridor. No wheezing, rhonchi or rales.   Chest:      Chest wall: No tenderness.   Musculoskeletal:      Cervical back: Normal range of motion and neck supple. No rigidity or tenderness.   Lymphadenopathy:      Cervical: No cervical adenopathy.   Skin:     General: Skin is warm and dry.      Capillary Refill: Capillary refill takes less than 2 seconds.       Coloration: Skin is not jaundiced or pale.      Findings: No bruising, erythema, lesion or rash.   Neurological:      General: No focal deficit present.      Mental Status: She is alert and oriented to person, place, and time.      Cranial Nerves: No cranial nerve deficit.      Sensory: No sensory deficit.      Motor: No weakness.      Gait: Gait normal.         DIAGNOSTIC RESULTS     EKG: All EKG's are interpreted by the Emergency Department Physician who either signs or Co-signs this chart in the absence of a cardiologist.        RADIOLOGY:   Non-plain film images such as CT, Ultrasound and MRI are read by the radiologist. Plain radiographic images are visualized and preliminarily interpreted by the emergency physician with the below findings:        Interpretation per the Radiologist below, if available at the time of this note:    No orders to display         ED BEDSIDE ULTRASOUND:   Performed by ED Physician - none    LABS:  Labs Reviewed - No data to display    All other labs were within normal range or not returned as of this dictation.    EMERGENCY DEPARTMENT COURSE and DIFFERENTIAL DIAGNOSIS/MDM:   Vitals:    Vitals:    03/05/23 1728   BP: 129/67   Pulse: 87   Resp: 18  Temp: 98.3 F (36.8 C)   TempSrc: Oral   SpO2: 98%   Weight: 63.5 kg (140 lb)   Height: 1.753 m (5\' 9" )           Medical Decision Making  Primary concern for corneal abrasion, although not visualized with equipment in the ED she will be treated as such.  Additionally she will be given follow-up recommendations to optometry and to not wear contact lenses until seen by the optometrist.  Patient shows understanding.  Low concern for acute angle glaucoma, iritis.    Problems Addressed:    Differential Diagnosis:  Ruled In: Corneal abrasion  Ruled Out:    Data Complexity:   QHP Sources: Patient  Summarization of Records Reviewed: I have reviewed the patient's most up-to-date medication list, allergies, social history  Results of Studies and  Effect on Management:  Independent Interpretation: No signs    Risk Element/Considerations:   Admission or Observation: N  Follow Up Care Discussed: Optometry  Studies:   Meds Given or Considered: Moxifloxacin ophthalmic drops prescribed  Reassessment:    Social Determinants Affecting Care:           Risk  Prescription drug management.        FINAL IMPRESSION      1. Left corneal abrasion, initial encounter          DISPOSITION/PLAN   DISPOSITION Decision To Discharge 03/05/2023 05:37:38 PM      PATIENT REFERRED TO:  Esmeralda Links, MD  [address]  [place] [address]  [phone]    Schedule an appointment as soon as possible for a visit         DISCHARGE MEDICATIONS:  New Prescriptions    MOXIFLOXACIN (VIGAMOX) 0.5 % OPHTHALMIC SOLUTION    Place 1 drop into the left eye 3 times daily for 7 days     Controlled Substances Monitoring:          No data to display                (Please note that portions of this note were completed with a voice recognition program.  Efforts were made to edit the dictations but occasionally words are mis-transcribed.)    Rosanna Randy, MD (electronically signed)  Attending Emergency Physician            Audry Pili, MD  03/05/23 [phone]

## 2023-03-05 NOTE — Discharge Instructions (Signed)
Although not visualized with equipment in the emergency department, you may likely have a corneal abrasion to the left eye.  I recommend follow-up with the ophthalmologist or optometrist Monday.  Use the antibiotic ointment as prescribed and do not wear contact lenses until you see the optometrist/ophthalmologist.

## 2023-03-05 NOTE — ED Triage Notes (Signed)
Patient c/o irritation to left eye.  She states rubbing her eye today prior to onset of left eye pain.   States contact use, but no contacts present to eye at this time.

## 2023-03-05 NOTE — ED Notes (Signed)
Discharge instructions reviewed with patient. Patient advised to follow up as directed on discharge instructions.  Patient denies questions, needs or concerns at this time.  Patient verbalized understanding. No s/sx of distress noted.

## 2023-12-22 ENCOUNTER — Inpatient Hospital Stay: Admit: 2023-12-22 | Discharge: 2023-12-22 | Disposition: A | Discharge status: Home / Self Care

## 2023-12-22 ENCOUNTER — Emergency Department

## 2023-12-22 ENCOUNTER — Emergency Department: Admit: 2023-12-22

## 2023-12-22 DIAGNOSIS — S5011XA Contusion of right forearm, initial encounter: Secondary | ICD-10-CM

## 2023-12-22 NOTE — ED Provider Notes (Signed)
 EMERGENCY DEPARTMENT HISTORY AND PHYSICAL EXAM      Date: 12/22/2023  Patient Name: Tina Merritt  Patient Age and Sex: 48 y.o. female     History of Presenting Illness     Chief Complaint   Patient presents with    Arm Injury       History Provided By: Patient    HPI: Tina Merritt patient arrived in the ER with pain in her left forearm.  Patient states that she does not have any hydraulics in her trunk and yesterday at 11 AM at the trunk lid fell down hit her on the wrist.  She states it was done the distal one third of the wrist where it hit however when she woke up at 8 PM she had a lot of pain in her proximal forearm.  At 8 PM she took some Motrin.  Patient states that she still in a lot of pain especially when touching the proximal one third of her forearm on the left forearm proximal one third of the dorsal of the mid radius.  It is boggy and very tender.  But not tender at the olecranon nor the distal humerus.  Her elbow itself does not hurt it is distal to that.  She states otherwise  No other complaints.  Patient works at night that is why her app and 11 she slept till 8 took some Motrin to try to get a work.    There are no other complaints, changes, or physical findings at this time.    Past History     Past Medical History:  Past Medical History:   Diagnosis Date    ADHD        Past Surgical History:  Past Surgical History:   Procedure Laterality Date    APPENDECTOMY      CESAREAN SECTION         Family History:  History reviewed. No pertinent family history.    Social History:  Social History     Tobacco Use    Smoking status: Every Day     Current packs/day: 1.00     Types: Cigarettes    Smokeless tobacco: Never       Allergies:  Allergies   Allergen Reactions    Promethazine Other (See Comments)     Other reaction(s): other/intolerance  Restless legs  Restless legs  Legs shaking          PCP: None, None    No current facility-administered medications on file prior to encounter.     Current  Outpatient Medications on File Prior to Encounter   Medication Sig Dispense Refill    amphetamine-dextroamphetamine (ADDERALL) 20 MG tablet Take 1 tablet by mouth 3 times daily. Max Daily Amount: 60 mg (Patient not taking: Reported on 12/22/2023)      QUEtiapine (SEROQUEL XR) 50 MG extended release tablet Take 1 tablet by mouth nightly as needed (Patient not taking: Reported on 12/22/2023)         Review of Systems   Review of Systems   Constitutional:  Positive for activity change. Negative for chills and fever.   HENT: Negative.     Respiratory: Negative.     Cardiovascular: Negative.    Musculoskeletal:  Negative for joint swelling.        Left forearm pain where she got hit was the distal end of the wrist the back of the wrist radial and ulnar where she hurts the most is at the proximal end  of the dorsum of the radial and ulnar area   Neurological: Negative.  Negative for weakness and numbness.        With the swelling she feels like she started to get some tingling in her hand        Physical Exam   Physical Exam  Constitutional:       Appearance: Normal appearance.   HENT:      Head: Normocephalic.   Cardiovascular:      Rate and Rhythm: Normal rate and regular rhythm.   Pulmonary:      Effort: Pulmonary effort is normal.      Breath sounds: Normal breath sounds.   Musculoskeletal:         General: Swelling, tenderness and signs of injury present.      Comments: The left proximal forearm is swollen and tender however hand she has good range of motion good sensation.   Skin:     General: Skin is warm and dry.      Capillary Refill: Capillary refill takes less than 2 seconds.   Neurological:      Mental Status: She is alert and oriented to person, place, and time.   Psychiatric:         Mood and Affect: Mood normal.          Diagnostic Study Results     Labs -   No results found for this or any previous visit (from the past 12 hour(s)).    Radiologic Studies -   XR RADIUS ULNA LEFT (2 VIEWS)   Final Result   No  acute abnormality.      Electronically signed by Elicia Lamp        @CT48 @  @CXR48 @      Medical Decision Making   I am the first provider for this patient. I reviewed the vital signs, available nursing notes, past medical history, past surgical history, family history and social history.    Vital Signs-Reviewed the patient's vital signs.  Patient Vitals for the past 12 hrs:   Temp Pulse Resp BP SpO2   12/22/23 0236 98.4 F (36.9 C) 85 16 131/74 100 %       Records Reviewed: Nursing Notes and Old Medical Records    Differential Diagnosis and MDM:   X-ray was negative.  I went to talk to the patient.  I think she is just contused that it is weird that the hit from the trunk was on the distal part of the forearm and the pain is at the proximal however that is what happened.  Talked about using ice, heat 10 to 20 minutes every hour as needed and helpful while awake.  And to make sure that there is cloth between her skin and the source of either hide heat or ice.  Will give her a sling to help support the arm.  And told her to take Tylenol and/or ibuprofen as needed for pain    ED Course:   Initial assessment performed. The patients presenting problems have been discussed, and they are in agreement with the care plan formulated and outlined with them.  I have encouraged them to ask questions as they arise throughout their visit.               Disposition:  Discharge Note: The patient is stable for discharge home. The signs, symptoms, diagnosis, and discharge instructions have been discussed, understanding conveyed, and agreed upon. The patient is to follow up as recommended  or return to ER should their symptoms worsen.      PLAN:     Medication List        ASK your doctor about these medications      amphetamine-dextroamphetamine 20 MG tablet  Commonly known as: ADDERALL     QUEtiapine 50 MG extended release tablet  Commonly known as: SEROQUEL XR            2. @FUDC @  3.  Return to ED if worse     Diagnosis      Clinical Impression:   1. Contusion of right forearm, initial encounter        Attestations:  I, Stasia Cavalier, DO, am the primary clinician of record.         Please note that this dictation was completed with Dragon, the computer voice recognition software.  Quite often unanticipated grammatical, syntax, homophones, and other interpretive errors are inadvertently transcribed by the computer software.  Please disregard these errors.  Please excuse any errors that have escaped final proofreading.  Thank you.      Stasia Cavalier, DO  12/22/23 [phone]

## 2023-12-22 NOTE — ED Triage Notes (Signed)
 Patient states that around 11 yesterday morning the trunk of her car fell and landed on her left forearm. Has had pain and swelling since. Patient has been taking Motrin OTC for pain, last dose around 2000.

## 2023-12-22 NOTE — Discharge Instructions (Signed)
 Take ibuprofen you can take 600 mg every 6 hours or if you are happy with 400 mg every 4 as fine.  The higher doses caused irritation to the stomach.  You can also take acetaminophen which is Tylenol while you are taking the ibuprofen.  Tylenol follow package directions for dosing.  For some relief you can apply an ice pack to the sore area of your arm 10 to 20 minutes every hour while awake if helpful and needed.  Make sure that there is cloth between your skin and the source of the ice.  To avoid the of this is to avoid frostbite.  In about 36 to 48 hours after the injury go ahead and switch to heat 10 to 20 minutes every hour while needed and helpful and make sure there is cloth between your skin and the heat source so you do not burn your skin.  Wear the sling for support.  If you are needing to use your sling longer than 3 to 5 days please make an appointment for follow-up with primary care physician.
